# Patient Record
Sex: Male | Born: 1990
Health system: Southern US, Community
[De-identification: ages and names within clinical notes are randomized; demographics above are authoritative.]

## PROBLEM LIST (undated history)

## (undated) DIAGNOSIS — F419 Anxiety disorder, unspecified: Secondary | ICD-10-CM

## (undated) DIAGNOSIS — T7840XA Allergy, unspecified, initial encounter: Secondary | ICD-10-CM

## (undated) HISTORY — PX: REPAIR ANKLE LIGAMENT: SUR1187

## (undated) HISTORY — DX: Allergy, unspecified, initial encounter: T78.40XA

## (undated) HISTORY — PX: WISDOM TOOTH EXTRACTION: SHX21

## (undated) HISTORY — DX: Anxiety disorder, unspecified: F41.9

---
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
## 2010-08-17 ENCOUNTER — Encounter: Payer: Self-pay | Admitting: Family Medicine

## 2010-08-17 DIAGNOSIS — T7840XA Allergy, unspecified, initial encounter: Secondary | ICD-10-CM | POA: Insufficient documentation

## 2011-10-22 ENCOUNTER — Encounter (HOSPITAL_COMMUNITY): Payer: Self-pay | Admitting: Emergency Medicine

## 2011-10-22 ENCOUNTER — Emergency Department (HOSPITAL_COMMUNITY)
Admission: EM | Admit: 2011-10-22 | Discharge: 2011-10-22 | Disposition: A | Discharge status: Home / Self Care | Payer: 59 | Attending: Emergency Medicine | Admitting: Emergency Medicine

## 2011-10-22 DIAGNOSIS — S01319A Laceration without foreign body of unspecified ear, initial encounter: Secondary | ICD-10-CM

## 2011-10-22 DIAGNOSIS — W268XXA Contact with other sharp object(s), not elsewhere classified, initial encounter: Secondary | ICD-10-CM | POA: Insufficient documentation

## 2011-10-22 DIAGNOSIS — S01309A Unspecified open wound of unspecified ear, initial encounter: Secondary | ICD-10-CM | POA: Insufficient documentation

## 2011-10-22 DIAGNOSIS — F17200 Nicotine dependence, unspecified, uncomplicated: Secondary | ICD-10-CM | POA: Insufficient documentation

## 2011-10-22 MED ORDER — TRAMADOL HCL 50 MG PO TABS
50.0000 mg | ORAL_TABLET | Freq: Once | ORAL | Status: AC
  Administered 2011-10-22: 50 mg via ORAL
  Filled 2011-10-22: qty 1

## 2011-10-22 MED ORDER — HYDROCODONE-ACETAMINOPHEN 5-325 MG PO TABS
1.0000 | ORAL_TABLET | Freq: Once | ORAL | Status: AC
  Administered 2011-10-22: 1 via ORAL
  Filled 2011-10-22: qty 1

## 2011-10-22 NOTE — ED Notes (Addendum)
Pt alert, NAD, calm, interactive, skin W&D, resps e/u, speaking in clear complete sentences, lying in stretcher, friend at [facility], ice pack on head.
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
## 2011-10-22 NOTE — ED Notes (Signed)
MD at bedside. 
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
## 2011-10-22 NOTE — ED Notes (Signed)
Pt given ice pack for hematoma above L ear.

## 2011-12-21 NOTE — ED Provider Notes (Signed)
Medical screening examination/treatment/procedure(s) were performed by non-physician practitioner and as supervising physician I was immediately available for consultation/collaboration.  Emani Taussig R. Cyndie Woodbeck, MD 12/21/11 0701 

## 2011-12-21 NOTE — ED Provider Notes (Signed)
  Physical Exam  BP 103/52  Pulse 81  Temp 97.7 F (36.5 C) (Oral)  Resp 18  SpO2 100%  Physical Exam  ED Course  Procedures  LACERATION REPAIR Performed by: Carlyle Dolly Authorized by: Carlyle Dolly Consent: Verbal consent obtained. Risks and benefits: risks, benefits and alternatives were discussed Consent given by: patient Patient identity confirmed: provided demographic data Prepped and Draped in normal sterile fashion Wound explored  Laceration Location: L ear  Laceration Length: 3 cm  No Foreign Bodies seen or palpated  Anesthesia: local infiltration  Local anesthetic: lidocaine 2% wo epinephrine  Anesthetic total: 4 ml  Irrigation method: syringe Amount of cleaning: standard  Skin closure: 7-0 Prolene   Number of sutures: 6  Technique: simple interrupted  Patient tolerance: Patient tolerated the procedure well with no immediate complications.      Carlyle Dolly, PA-C 12/21/11 [phone]

## 2012-03-29 ENCOUNTER — Emergency Department (HOSPITAL_COMMUNITY)
Admission: EM | Admit: 2012-03-29 | Discharge: 2012-03-29 | Disposition: A | Discharge status: Home / Self Care | Payer: 59 | Attending: Emergency Medicine | Admitting: Emergency Medicine

## 2012-03-29 ENCOUNTER — Encounter (HOSPITAL_COMMUNITY): Payer: Self-pay | Admitting: Emergency Medicine

## 2012-03-29 DIAGNOSIS — Z23 Encounter for immunization: Secondary | ICD-10-CM | POA: Insufficient documentation

## 2012-03-29 DIAGNOSIS — F17200 Nicotine dependence, unspecified, uncomplicated: Secondary | ICD-10-CM | POA: Insufficient documentation

## 2012-03-29 DIAGNOSIS — Z203 Contact with and (suspected) exposure to rabies: Secondary | ICD-10-CM

## 2012-03-29 DIAGNOSIS — Y939 Activity, unspecified: Secondary | ICD-10-CM | POA: Insufficient documentation

## 2012-03-29 DIAGNOSIS — IMO0002 Reserved for concepts with insufficient information to code with codable children: Secondary | ICD-10-CM | POA: Insufficient documentation

## 2012-03-29 DIAGNOSIS — W64XXXA Exposure to other animate mechanical forces, initial encounter: Secondary | ICD-10-CM | POA: Insufficient documentation

## 2012-03-29 DIAGNOSIS — Y9289 Other specified places as the place of occurrence of the external cause: Secondary | ICD-10-CM | POA: Insufficient documentation

## 2012-03-29 DIAGNOSIS — Z8589 Personal history of malignant neoplasm of other organs and systems: Secondary | ICD-10-CM | POA: Insufficient documentation

## 2012-03-29 MED ORDER — TETANUS-DIPHTH-ACELL PERTUSSIS 5-2.5-18.5 LF-MCG/0.5 IM SUSP
0.5000 mL | Freq: Once | INTRAMUSCULAR | Status: AC
  Administered 2012-03-29: 0.5 mL via INTRAMUSCULAR
  Filled 2012-03-29: qty 0.5

## 2012-03-29 MED ORDER — RABIES IMMUNE GLOBULIN 150 UNIT/ML IM INJ
20.0000 [IU]/kg | INJECTION | Freq: Once | INTRAMUSCULAR | Status: AC
  Administered 2012-03-29: 2025 [IU] via INTRAMUSCULAR
  Filled 2012-03-29: qty 14
  Filled 2012-03-29: qty 2

## 2012-03-29 MED ORDER — RABIES VACCINE, PCEC IM SUSR
1.0000 mL | Freq: Once | INTRAMUSCULAR | Status: AC
  Administered 2012-03-29: 1 mL via INTRAMUSCULAR
  Filled 2012-03-29: qty 1

## 2012-03-29 NOTE — ED Notes (Signed)
Patient with no complaints at this time. Respirations even and unlabored. Skin warm/dry. Discharge instructions reviewed with patient at this time. Patient given opportunity to voice concerns/ask questions. Patient discharged at this time and left Emergency Department with steady gait.   
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
## 2012-04-02 ENCOUNTER — Encounter (HOSPITAL_COMMUNITY): Payer: 59 | Attending: Emergency Medicine

## 2012-04-02 VITALS — BP 114/70 | HR 64 | Temp 98.0°F | Resp 16

## 2012-04-02 DIAGNOSIS — S61259D Open bite of unspecified finger without damage to nail, subsequent encounter: Secondary | ICD-10-CM

## 2012-04-02 DIAGNOSIS — Z23 Encounter for immunization: Secondary | ICD-10-CM | POA: Insufficient documentation

## 2012-04-02 MED ORDER — RABIES VACCINE, PCEC IM SUSR
1.0000 mL | Freq: Once | INTRAMUSCULAR | Status: AC
  Administered 2012-04-02: 1 mL via INTRAMUSCULAR

## 2012-04-02 NOTE — Progress Notes (Signed)
Samuel Cunningham presents today for injection per MD orders. RabAvert administered IM in right Upper Arm. Administration without incident. Patient tolerated well.  

## 2012-04-06 ENCOUNTER — Encounter (HOSPITAL_COMMUNITY): Payer: 59

## 2012-04-06 DIAGNOSIS — S61259D Open bite of unspecified finger without damage to nail, subsequent encounter: Secondary | ICD-10-CM

## 2012-04-06 MED ORDER — RABIES VACCINE, PCEC IM SUSR
1.0000 mL | Freq: Once | INTRAMUSCULAR | Status: AC
  Administered 2012-04-06: 1 mL via INTRAMUSCULAR

## 2012-04-06 NOTE — Progress Notes (Signed)
Samuel Cunningham presents today for injection per MD orders. RabAvert administered IM in right Upper Arm. Administration without incident. Patient tolerated well.

## 2012-04-13 ENCOUNTER — Ambulatory Visit (HOSPITAL_COMMUNITY): Payer: PRIVATE HEALTH INSURANCE

## 2012-04-16 ENCOUNTER — Encounter (HOSPITAL_COMMUNITY): Payer: 59 | Attending: Emergency Medicine

## 2012-04-16 VITALS — BP 114/67 | HR 72 | Temp 97.3°F | Resp 18

## 2012-04-16 DIAGNOSIS — IMO0001 Reserved for inherently not codable concepts without codable children: Secondary | ICD-10-CM | POA: Insufficient documentation

## 2012-04-16 DIAGNOSIS — S61259S Open bite of unspecified finger without damage to nail, sequela: Secondary | ICD-10-CM

## 2012-04-16 DIAGNOSIS — S61209A Unspecified open wound of unspecified finger without damage to nail, initial encounter: Secondary | ICD-10-CM | POA: Insufficient documentation

## 2012-04-16 MED ORDER — RABIES VACCINE, PCEC IM SUSR
1.0000 mL | Freq: Once | INTRAMUSCULAR | Status: AC
  Administered 2012-04-16: 1 mL via INTRAMUSCULAR

## 2012-04-16 NOTE — Progress Notes (Signed)
[name] presents today for injection per MD orders. Rabavert  administered IM in right Upper Arm. Administration without incident. Patient tolerated well.

## 2013-05-18 ENCOUNTER — Emergency Department: Payer: Self-pay | Admitting: Emergency Medicine

## 2014-05-26 ENCOUNTER — Ambulatory Visit (INDEPENDENT_AMBULATORY_CARE_PROVIDER_SITE_OTHER): Payer: BLUE CROSS/BLUE SHIELD | Admitting: Family Medicine

## 2014-05-26 ENCOUNTER — Encounter: Payer: Self-pay | Admitting: Family Medicine

## 2014-05-26 VITALS — BP 110/80 | HR 78 | Temp 98.3°F | Resp 14 | Ht 72.0 in | Wt 220.0 lb

## 2014-05-26 DIAGNOSIS — F411 Generalized anxiety disorder: Secondary | ICD-10-CM | POA: Diagnosis not present

## 2014-05-26 MED ORDER — ESCITALOPRAM OXALATE 10 MG PO TABS: 10.0000 mg | ORAL_TABLET | Freq: Every day | ORAL | Status: DC

## 2014-05-26 MED ORDER — ALPRAZOLAM 1 MG PO TABS: 1.0000 mg | ORAL_TABLET | Freq: Three times a day (TID) | ORAL | Status: DC | PRN

## 2014-05-26 NOTE — Progress Notes (Signed)
   Subjective:    Patient ID: Samuel Cunningham, male    DOB: 05/20/1990, 24 y.o.   MRN: [id number]  HPI Patient is a very pleasant 24 year old white male who presents today complaining of anxiety problems. Symptoms have been worsening gradually for the last few months however one month ago they acutely worsened. He reports daily anxiety. He generally feels anxious and shaky at all times.  He has trouble sleeping. At times he has panic attacks were has difficult time breathing and he feels like the walls are closing in on him. He reports flushing and feeling hot and feeling lightheaded at times. He also reports some mild depression and anhedonia. His energy level has been okay. He denies any suicidal ideation. He denies any change in his appetite. One month ago the symptoms acutely worsen as he started a new job, his girlfriend broke up with him, and his grandmother was placed on hospice with end-stage dementia. She recently passed away over the weekend and her funeral is today. This all seemed to worsen anxiety. However now the patient is having a hard time just performing his routine daily activities. Past Medical History  Diagnosis Date  . Allergy   . Anxiety    Past Surgical History  Procedure Laterality Date  . Repair ankle ligament     No Known Allergies History   Social History  . Marital Status: Single    Spouse Name: N/A  . Number of Children: N/A  . Years of Education: N/A   Occupational History  . Not on file.   Social History Main Topics  . Smoking status: Passive Smoke Exposure - Never Smoker  . Smokeless tobacco: Current User    Types: Chew  . Alcohol Use: Yes  . Drug Use: No  . Sexual Activity: Not on file   Other Topics Concern  . Not on file   Social History Narrative   Family History  Problem Relation Age of Onset  . Hypertension Mother   . Mental illness Brother     anxiety      Review of Systems  All other systems reviewed and are negative.        Objective:   Physical Exam  Cardiovascular: Normal rate, regular rhythm, normal heart sounds and intact distal pulses.   Pulmonary/Chest: Effort normal and breath sounds normal.  Psychiatric: He has a normal mood and affect. His behavior is normal. Judgment and thought content normal.  Vitals reviewed.         Assessment & Plan:  GAD (generalized anxiety disorder) - Plan: escitalopram (LEXAPRO) 10 MG tablet, ALPRAZolam (XANAX) 1 MG tablet  Patient would like to take something that is not addictive and non-habit-forming to help prevent anxiety. He is very leery of taking medications he could become addicted to. Therefore we will start the patient on Lexapro 10 mg by mouth daily and recheck in one month. I did give the patient a prescription for Xanax 1 mg tablets. He can take one half a tablet to 1 tablet every 8 hours as needed for panic attack. I gave him 10 tablets and asked the patient to take these sparingly just as needed for severe anxiety at least until the SSRI takes effect. Recheck in one month or sooner if worse

## 2014-06-13 ENCOUNTER — Other Ambulatory Visit: Payer: Self-pay | Admitting: Family Medicine

## 2014-06-13 ENCOUNTER — Encounter: Payer: Self-pay | Admitting: Family Medicine

## 2014-06-13 NOTE — Telephone Encounter (Signed)
This encounter was created in error - please disregard.

## 2014-06-13 NOTE — Telephone Encounter (Signed)
Opened in error

## 2014-06-13 NOTE — Telephone Encounter (Signed)
Ok for #10 

## 2014-06-13 NOTE — Telephone Encounter (Signed)
rx called in #10 only 

## 2014-06-13 NOTE — Telephone Encounter (Signed)
Ok to refill??  Last office visit/ refill 05/26/2014, #10 tabs.

## 2014-07-03 ENCOUNTER — Other Ambulatory Visit: Payer: Self-pay | Admitting: Family Medicine

## 2014-07-03 NOTE — Telephone Encounter (Signed)
Ok to refill??  Last office visit 05/26/2014.  Last refill 06/13/2014.

## 2014-07-04 NOTE — Telephone Encounter (Signed)
Ok with refill (10)

## 2014-07-04 NOTE — Telephone Encounter (Signed)
rx called in #10 only

## 2014-07-25 ENCOUNTER — Telehealth: Payer: Self-pay | Admitting: Family Medicine

## 2014-07-25 NOTE — Telephone Encounter (Signed)
Patient is calling to speak with you only about his lexapro  [phone]

## 2014-07-28 MED ORDER — ALPRAZOLAM 1 MG PO TABS: 1.0000 mg | ORAL_TABLET | Freq: Every day | ORAL | Status: DC

## 2014-07-28 NOTE — Telephone Encounter (Signed)
Med called to pharm 

## 2014-07-28 NOTE — Telephone Encounter (Signed)
Please call in 30 xanax 1 mg poqday prn

## 2014-08-04 ENCOUNTER — Ambulatory Visit (INDEPENDENT_AMBULATORY_CARE_PROVIDER_SITE_OTHER): Payer: BLUE CROSS/BLUE SHIELD | Admitting: Family Medicine

## 2014-08-04 ENCOUNTER — Encounter: Payer: Self-pay | Admitting: Family Medicine

## 2014-08-04 VITALS — BP 110/62 | HR 68 | Temp 98.9°F | Resp 14 | Ht 72.0 in | Wt 223.0 lb

## 2014-08-04 DIAGNOSIS — F13939 Sedative, hypnotic or anxiolytic use, unspecified with withdrawal, unspecified: Secondary | ICD-10-CM

## 2014-08-04 DIAGNOSIS — L739 Follicular disorder, unspecified: Secondary | ICD-10-CM

## 2014-08-04 DIAGNOSIS — F13239 Sedative, hypnotic or anxiolytic dependence with withdrawal, unspecified: Secondary | ICD-10-CM | POA: Diagnosis not present

## 2014-08-04 MED ORDER — DOXYCYCLINE HYCLATE 100 MG PO TABS: 100.0000 mg | ORAL_TABLET | Freq: Two times a day (BID) | ORAL | Status: DC

## 2014-08-04 NOTE — Progress Notes (Signed)
Subjective:    Patient ID: Samuel Cunningham, male    DOB: 07-11-1990, 24 y.o.   MRN: [id number]  HPI 05/26/14 Patient is a very pleasant 24 year old white male who presents today complaining of anxiety problems. Symptoms have been worsening gradually for the last few months however one month ago they acutely worsened. He reports daily anxiety. He generally feels anxious and shaky at all times.  He has trouble sleeping. At times he has panic attacks were has difficult time breathing and he feels like the walls are closing in on him. He reports flushing and feeling hot and feeling lightheaded at times. He also reports some mild depression and anhedonia. His energy level has been okay. He denies any suicidal ideation. He denies any change in his appetite. One month ago the symptoms acutely worsen as he started a new job, his girlfriend broke up with him, and his grandmother was placed on hospice with end-stage dementia. She recently passed away over the weekend and her funeral is today. This all seemed to worsen anxiety. However now the patient is having a hard time just performing his routine daily activities.  At that time, my plan was:  Patient would like to take something that is not addictive and non-habit-forming to help prevent anxiety. He is very leery of taking medications he could become addicted to. Therefore we will start the patient on Lexapro 10 mg by mouth daily and recheck in one month. I did give the patient a prescription for Xanax 1 mg tablets. He can take one half a tablet to 1 tablet every 8 hours as needed for panic attack. I gave him 10 tablets and asked the patient to take these sparingly just as needed for severe anxiety at least until the SSRI takes effect. Recheck in one month or sooner if worse  08/04/14 Thursday, patient abruptly stopped lexapro by accident.  Medication was helping, he just didn't get refill on time.  By Sat, felt dizzy, nauseated, weak, with dull headache.   Denies fever, chills, vomiting, or diarrhea.  Resumed medication Sunday night and symptoms have already improved.  Also has small white pinpoint pustules on thigh near scrotum. Past Medical History  Diagnosis Date  . Allergy   . Anxiety    Past Surgical History  Procedure Laterality Date  . Repair ankle ligament     No Known Allergies History   Social History  . Marital Status: Single    Spouse Name: N/A  . Number of Children: N/A  . Years of Education: N/A   Occupational History  . Not on file.   Social History Main Topics  . Smoking status: Passive Smoke Exposure - Never Smoker  . Smokeless tobacco: Current User    Types: Chew  . Alcohol Use: Yes  . Drug Use: No  . Sexual Activity: Not on file   Other Topics Concern  . Not on file   Social History Narrative   Family History  Problem Relation Age of Onset  . Hypertension Mother   . Mental illness Brother     anxiety      Review of Systems  All other systems reviewed and are negative.      Objective:   Physical Exam  Cardiovascular: Normal rate, regular rhythm, normal heart sounds and intact distal pulses.   Pulmonary/Chest: Effort normal and breath sounds normal.  Psychiatric: He has a normal mood and affect. His behavior is normal. Judgment and thought content normal.  Vitals reviewed.  Assessment & Plan:  Folliculitis - Plan: doxycycline (VIBRA-TABS) 100 MG tablet  Withdrawal from sedative, hypnotic, or anxiolytic drug  Resume lexapro and avoid abrupt discontinuation in future.  Doxycycline 100 mg pobid for 10 days.  Recheck in 3 months or as needed.

## 2014-08-05 ENCOUNTER — Encounter: Payer: Self-pay | Admitting: Family Medicine

## 2014-08-28 ENCOUNTER — Other Ambulatory Visit: Payer: Self-pay | Admitting: Family Medicine

## 2014-08-28 NOTE — Telephone Encounter (Signed)
?   OK to Refill  

## 2014-08-29 NOTE — Telephone Encounter (Signed)
Medication refilled per protocol. 

## 2014-08-29 NOTE — Telephone Encounter (Signed)
ok 

## 2014-09-29 ENCOUNTER — Other Ambulatory Visit: Payer: Self-pay | Admitting: Family Medicine

## 2014-09-30 NOTE — Telephone Encounter (Signed)
Medication called to pharmacy. 

## 2014-09-30 NOTE — Telephone Encounter (Signed)
ok 

## 2014-09-30 NOTE — Telephone Encounter (Signed)
?   OK to Refill  

## 2014-11-06 ENCOUNTER — Other Ambulatory Visit: Payer: Self-pay | Admitting: Family Medicine

## 2014-11-07 NOTE — Telephone Encounter (Signed)
Medication called to pharmacy. 

## 2014-11-07 NOTE — Telephone Encounter (Signed)
Ok to refill??  Last office visit 08/04/2014.  Last refill 09/30/2014.

## 2014-11-07 NOTE — Telephone Encounter (Signed)
ok 

## 2014-12-02 ENCOUNTER — Other Ambulatory Visit: Payer: Self-pay | Admitting: Family Medicine

## 2014-12-02 NOTE — Telephone Encounter (Signed)
Refill appropriate and filled per protocol. 

## 2015-03-06 ENCOUNTER — Other Ambulatory Visit: Payer: Self-pay | Admitting: Family Medicine

## 2015-03-06 NOTE — Telephone Encounter (Signed)
?   OK to Refill  Last refill 11/07/14

## 2015-03-06 NOTE — Telephone Encounter (Signed)
ok 

## 2015-04-08 ENCOUNTER — Encounter: Payer: Self-pay | Admitting: Family Medicine

## 2015-04-08 ENCOUNTER — Ambulatory Visit (INDEPENDENT_AMBULATORY_CARE_PROVIDER_SITE_OTHER): Payer: BLUE CROSS/BLUE SHIELD | Admitting: Family Medicine

## 2015-04-08 VITALS — BP 130/72 | HR 80 | Temp 101.4°F | Resp 16 | Ht 72.0 in | Wt 205.0 lb

## 2015-04-08 DIAGNOSIS — J111 Influenza due to unidentified influenza virus with other respiratory manifestations: Secondary | ICD-10-CM | POA: Diagnosis not present

## 2015-04-08 LAB — INFLUENZA A AND B AG, IMMUNOASSAY
INFLUENZA A ANTIGEN: NOT DETECTED
INFLUENZA B ANTIGEN: NOT DETECTED

## 2015-04-08 MED ORDER — OSELTAMIVIR PHOSPHATE 75 MG PO CAPS: 75.0000 mg | ORAL_CAPSULE | Freq: Two times a day (BID) | ORAL | Status: DC

## 2015-04-08 MED ORDER — ACETAMINOPHEN 500 MG PO TABS
1000.0000 mg | ORAL_TABLET | Freq: Once | ORAL | Status: AC
  Administered 2015-04-08: 1000 mg via ORAL

## 2015-04-08 NOTE — Progress Notes (Signed)
Patient ID: Samuel Cunningham, male   DOB: 06/20/1990, 25 y.o.   MRN: [id number]   Subjective:    Patient ID: Samuel Cunningham, male    DOB: 1990/04/19, 25 y.o.   MRN: [id number]  Patient presents for Illness   Patient here with fever body aches/ chills headache congestion started last night. His father has bronchitis currently. He has not had any nausea vomiting no diarrhea. He took Mucinex DM  no known sick contacts. Feels like previous Flu  He works in [employer]     Review Of Systems:  GEN- denies fatigue, fever, weight loss,weakness, recent illness HEENT- denies eye drainage, change in vision, nasal discharge, CVS- denies chest pain, palpitations RESP- denies SOB, cough, wheeze ABD- denies N/V, change in stools, abd pain GU- denies dysuria, hematuria, dribbling, incontinence MSK- denies joint pain, +muscle aches, injury Neuro- +headache, dizziness, syncope, seizure activity       Objective:    BP 130/72 mmHg  Pulse 80  Temp(Src) 101.4 F (38.6 C) (Oral)  Resp 16  Ht 6' (1.829 m)  Wt 205 lb (92.987 kg)  BMI 27.80 kg/m2  SpO2 98% GEN- NAD, alert and oriented x3Febrile, ill appearing  HEENT- PERRL, EOMI, non injected sclera, pink conjunctiva, MMM, oropharynx clear, nares clear, TM clear bilat, no sinus tenderness Neck- Supple, noLAD  CVS- RRR, no murmur RESP-CTAB ABD-NABS,soft,NT,ND EXT- No edema Pulses- Radial  2+        Assessment & Plan:      Problem List Items Addressed This Visit    None    Visit Diagnoses    Influenza    -  Primary    Early influenza symptoms, given Tylenol in office, discsused fever reducers, given Tamiflu, CONTINUE mucinex    Relevant Medications    oseltamivir (TAMIFLU) 75 MG capsule    acetaminophen (TYLENOL) tablet 1,000 mg (Completed)    Other Relevant Orders    Influenza A and B Ag, Immunoassay (Completed)       Note: This dictation was prepared with Dragon dictation along with smaller phrase technology. Any  transcriptional errors that result from this process are unintentional.

## 2015-04-08 NOTE — Patient Instructions (Addendum)
Take flu medication as prescribed  Work note for Wed- Friday Can return Monday  No working out for 1 week [place] of fluids  F/U as needed

## 2015-04-10 ENCOUNTER — Other Ambulatory Visit: Payer: Self-pay | Admitting: Family Medicine

## 2015-04-10 NOTE — Telephone Encounter (Signed)
ok 

## 2015-04-10 NOTE — Telephone Encounter (Signed)
LRF 03/06/15 #30  LOV 08/04/14   OK refill?

## 2015-04-13 NOTE — Telephone Encounter (Signed)
rx called in

## 2015-04-27 ENCOUNTER — Encounter: Payer: Self-pay | Admitting: Physician Assistant

## 2015-04-27 ENCOUNTER — Ambulatory Visit (INDEPENDENT_AMBULATORY_CARE_PROVIDER_SITE_OTHER): Payer: BLUE CROSS/BLUE SHIELD | Admitting: Physician Assistant

## 2015-04-27 ENCOUNTER — Encounter: Payer: Self-pay | Admitting: Family Medicine

## 2015-04-27 VITALS — BP 120/70 | HR 76 | Temp 98.6°F | Resp 18 | Wt 204.0 lb

## 2015-04-27 DIAGNOSIS — J988 Other specified respiratory disorders: Secondary | ICD-10-CM

## 2015-04-27 DIAGNOSIS — R6883 Chills (without fever): Secondary | ICD-10-CM

## 2015-04-27 DIAGNOSIS — J029 Acute pharyngitis, unspecified: Secondary | ICD-10-CM | POA: Diagnosis not present

## 2015-04-27 DIAGNOSIS — B9689 Other specified bacterial agents as the cause of diseases classified elsewhere: Secondary | ICD-10-CM

## 2015-04-27 LAB — INFLUENZA A AND B AG, IMMUNOASSAY
INFLUENZA A ANTIGEN: NOT DETECTED
INFLUENZA B ANTIGEN: NOT DETECTED

## 2015-04-27 LAB — STREP GROUP A AG, W/REFLEX TO CULT: STREGTOCOCCUS GROUP A AG SCREEN: NOT DETECTED

## 2015-04-27 MED ORDER — BENZONATATE 200 MG PO CAPS: 200.0000 mg | ORAL_CAPSULE | Freq: Two times a day (BID) | ORAL | Status: DC | PRN

## 2015-04-27 MED ORDER — AZITHROMYCIN 250 MG PO TABS: ORAL_TABLET | ORAL | Status: DC

## 2015-04-27 NOTE — Progress Notes (Signed)
    Patient ID: Samuel RenoMichael C Cunningham MRN: [id number], DOB: [birth year], 25 y.o. Date of Encounter: 04/27/2015, 3:04 PM    Chief Complaint:  Chief Complaint  Patient presents with  . sick x 2 weeks    had cold, thought better, now last few days feels awful, chills, body aches     HPI: 25 y.o. year old white male presents with above.   In addition to symptoms listed above, he says that he is having congestion in his head and nose and also in his chest. Says his throat does feel sore. Says he does not have a thermometer so has not checked temperature. Says a coworker has strep throat but no other known sick contacts. Works with [employer].     Home Meds:   Outpatient Prescriptions Prior to Visit  Medication Sig Dispense Refill  . escitalopram (LEXAPRO) 10 MG tablet TAKE 1 TABLET BY MOUTH AT BEDTIME. 30 tablet 2  . ALPRAZolam (XANAX) 1 MG tablet TAKE 1 TABLET EVERY DAY 30 tablet 0  . oseltamivir (TAMIFLU) 75 MG capsule Take 1 capsule (75 mg total) by mouth 2 (two) times daily. 10 capsule 0   No facility-administered medications prior to visit.    Allergies:  Allergies  Allergen Reactions  . Codeine Itching and Nausea Only      Review of Systems: See HPI for pertinent ROS. All other ROS negative.    Physical Exam: Blood pressure 120/70, pulse 76, temperature 98.6 F (37 C), temperature source Oral, resp. rate 18, weight 204 lb (92.534 kg)., Body mass index is 27.66 kg/(m^2). General:  WNWD WM. Appears in no acute distress. HEENT: Normocephalic, atraumatic, eyes without discharge, sclera non-icteric, nares are without discharge. Bilateral auditory canals clear, TM's are without perforation, pearly grey and translucent with reflective cone of light bilaterally. Oral cavity moist, posterior pharynx without exudate, erythema, peritonsillar abscess.  Neck: Supple. No thyromegaly. No lymphadenopathy. Lungs: Clear bilaterally to auscultation without wheezes, rales, or rhonchi. Breathing  is unlabored. Heart: Regular rhythm. No murmurs, rubs, or gallops. Msk:  Strength and tone normal for age. Extremities/Skin: Warm and dry. Neuro: Alert and oriented X 3. Moves all extremities spontaneously. Gait is normal. CNII-XII grossly in tact. Psych:  Responds to questions appropriately with a normal affect.   Results for orders placed or performed in visit on 04/27/15  STREP GROUP A AG, W/REFLEX TO CULT  Result Value Ref Range   SOURCE THROAT    STREGTOCOCCUS GROUP A AG SCREEN Not Detected   Influenza A and B Ag, Immunoassay  Result Value Ref Range   Source: NASAL    Influenza A Antigen Not Detected Not Detected   Influenza B Antigen Not Detected Not Detected     ASSESSMENT AND PLAN:  25 y.o. year old male with  1. Bacterial respiratory infection Take antibiotic as directed. Can use Tessalon for cough suppressant. Continues over-the-counter medications for other symptom relief. Note given for out of work today and tomorrow. Follow-up if needed. - azithromycin (ZITHROMAX) 250 MG tablet; Day 1: Take 2 daily. Days 2-5: Take 1 daily.  Dispense: 6 tablet; Refill: 0 - benzonatate (TESSALON) 200 MG capsule; Take 1 capsule (200 mg total) by mouth 2 (two) times daily as needed for cough.  Dispense: 20 capsule; Refill: 0  2. Chills without fever - Influenza A and B Ag, Immunoassay  3. Sorethroat - STREP GROUP A AG, W/REFLEX TO CULT   Signed, [address] AvenueMary Beth BangsDixon, GeorgiaPA, [facility] 04/27/2015 3:04 PM

## 2015-05-18 ENCOUNTER — Other Ambulatory Visit: Payer: Self-pay | Admitting: Family Medicine

## 2015-05-18 NOTE — Telephone Encounter (Signed)
Ok to refill??  Last office visit 04/27/2015.  Last refill 04/13/2015.

## 2015-05-19 NOTE — Telephone Encounter (Signed)
Medication called to pharmacy. 

## 2015-05-19 NOTE — Telephone Encounter (Signed)
ok 

## 2015-06-22 ENCOUNTER — Other Ambulatory Visit: Payer: Self-pay | Admitting: Family Medicine

## 2015-06-22 NOTE — Telephone Encounter (Signed)
ok 

## 2015-06-22 NOTE — Telephone Encounter (Signed)
Medication called to pharmacy. 

## 2015-06-22 NOTE — Telephone Encounter (Signed)
Ok to refill??  Last office visit 04/27/2015.  Last refill 05/19/2015.

## 2015-08-04 ENCOUNTER — Other Ambulatory Visit: Payer: Self-pay | Admitting: Family Medicine

## 2015-08-04 NOTE — Telephone Encounter (Signed)
Ok to refill??  Last office visit 04/27/2015.  Last refill 06/22/2015.

## 2015-08-06 NOTE — Telephone Encounter (Signed)
ok 

## 2015-08-07 ENCOUNTER — Other Ambulatory Visit: Payer: Self-pay | Admitting: Family Medicine

## 2015-08-07 MED ORDER — ESCITALOPRAM OXALATE 10 MG PO TABS: 10.0000 mg | ORAL_TABLET | Freq: Every day | ORAL | Status: DC

## 2015-08-07 NOTE — Telephone Encounter (Signed)
Medication called/sent to requested pharmacy  

## 2015-08-07 NOTE — Telephone Encounter (Signed)
Medication called to pharmacy. 

## 2015-09-11 ENCOUNTER — Telehealth: Payer: Self-pay | Admitting: Family Medicine

## 2015-09-11 NOTE — Telephone Encounter (Signed)
Ok with 30, but if he NTBS.  This was never intended to be long term.

## 2015-09-11 NOTE — Telephone Encounter (Signed)
CB# [phone]  Patient stopped by would like a refill on his xanax he is requesting a 60 tablets instead of 30 called into [facility] on [address].

## 2015-09-14 MED ORDER — ALPRAZOLAM 1 MG PO TABS: ORAL_TABLET | ORAL | 0 refills | Status: DC

## 2015-09-14 NOTE — Telephone Encounter (Signed)
Patient aware of providers recommendations and med called to pharm

## 2015-09-30 ENCOUNTER — Ambulatory Visit (INDEPENDENT_AMBULATORY_CARE_PROVIDER_SITE_OTHER): Payer: BLUE CROSS/BLUE SHIELD | Admitting: Physician Assistant

## 2015-09-30 ENCOUNTER — Encounter: Payer: Self-pay | Admitting: Family Medicine

## 2015-09-30 ENCOUNTER — Encounter: Payer: Self-pay | Admitting: Physician Assistant

## 2015-09-30 VITALS — BP 128/62 | HR 55 | Temp 97.6°F | Resp 16 | Ht 72.0 in | Wt 200.0 lb

## 2015-09-30 DIAGNOSIS — R0982 Postnasal drip: Secondary | ICD-10-CM

## 2015-09-30 DIAGNOSIS — K5900 Constipation, unspecified: Secondary | ICD-10-CM

## 2015-09-30 DIAGNOSIS — R5383 Other fatigue: Secondary | ICD-10-CM | POA: Diagnosis not present

## 2015-09-30 DIAGNOSIS — G44209 Tension-type headache, unspecified, not intractable: Secondary | ICD-10-CM | POA: Diagnosis not present

## 2015-09-30 DIAGNOSIS — L309 Dermatitis, unspecified: Secondary | ICD-10-CM

## 2015-09-30 LAB — CBC WITH DIFFERENTIAL/PLATELET
BASOS ABS: 42 {cells}/uL (ref 0–200)
Basophils Relative: 1 %
EOS PCT: 2 %
Eosinophils Absolute: 84 cells/uL (ref 15–500)
HEMATOCRIT: 38.3 % — AB (ref 38.5–50.0)
HEMOGLOBIN: 12.7 g/dL — AB (ref 13.0–17.0)
LYMPHS ABS: 1596 {cells}/uL (ref 850–3900)
Lymphocytes Relative: 38 %
MCH: 31.6 pg (ref 27.0–33.0)
MCHC: 33.2 g/dL (ref 32.0–36.0)
MCV: 95.3 fL (ref 80.0–100.0)
MONO ABS: 336 {cells}/uL (ref 200–950)
MPV: 10.1 fL (ref 7.5–12.5)
Monocytes Relative: 8 %
NEUTROS ABS: 2142 {cells}/uL (ref 1500–7800)
Neutrophils Relative %: 51 %
Platelets: 236 10*3/uL (ref 140–400)
RBC: 4.02 MIL/uL — AB (ref 4.20–5.80)
RDW: 13.5 % (ref 11.0–15.0)
WBC: 4.2 10*3/uL (ref 3.8–10.8)

## 2015-09-30 LAB — COMPLETE METABOLIC PANEL WITH GFR
ALBUMIN: 4.1 g/dL (ref 3.6–5.1)
ALK PHOS: 52 U/L (ref 40–115)
ALT: 21 U/L (ref 9–46)
AST: 22 U/L (ref 10–40)
BILIRUBIN TOTAL: 0.7 mg/dL (ref 0.2–1.2)
BUN: 14 mg/dL (ref 7–25)
CO2: 30 mmol/L (ref 20–31)
Calcium: 9 mg/dL (ref 8.6–10.3)
Chloride: 104 mmol/L (ref 98–110)
Creat: 0.96 mg/dL (ref 0.60–1.35)
GFR, Est African American: >89 mL/min (ref >=60)
GFR, Est Non African American: >89 mL/min (ref >=60)
GLUCOSE: 87 mg/dL (ref 70–99)
Potassium: 4.6 mmol/L (ref 3.5–5.3)
SODIUM: 140 mmol/L (ref 135–146)
TOTAL PROTEIN: 6 g/dL — AB (ref 6.1–8.1)

## 2015-09-30 LAB — TSH: TSH: 0.98 mIU/L (ref 0.40–4.50)

## 2015-09-30 MED ORDER — LINACLOTIDE 72 MCG PO CAPS: 72.0000 ug | ORAL_CAPSULE | Freq: Every day | ORAL | 2 refills | Status: DC

## 2015-09-30 MED ORDER — METAXALONE 800 MG PO TABS: 800.0000 mg | ORAL_TABLET | Freq: Three times a day (TID) | ORAL | 1 refills | Status: DC

## 2015-09-30 NOTE — Progress Notes (Signed)
Patient ID: Roseanne RenoMichael C Bluitt MRN: [id number], DOB: 1991/01/31, 25 y.o. Date of Encounter: @DATE @  Chief Complaint:  Chief Complaint  Patient presents with  . Dizziness    symptoms began Monday   . Fatigue  . Nausea  . Headache    HPI: 25 y.o. year old white male  presents with above.   Discussed above symptoms with him.  Says that since Monday he has had some nausea. Has had no vomiting. Had no diarrhea. Is that he actually has had some constipation----"like small rabbit terds for last 2 - 3 days"  Asked about his headache. He points to temple region bilaterally as area of headache.  Past of these had any nasal congestion or drainage from his nose---no. Also no chest congestion or cough. Says he sometimes feels some drainage down his throat.  Says that he has been exercising for 7 months. Runs 5 miles and goes to the gym. Says that yesterday he had to stop halfway through his exercise "because something just didn't feel right". As this morning that he should come here and get checked out rather than going to work right now.  Later adds that he has been using some testosterone booster and creatine for about 2 weeks and doesn't know if that would be causing any of this.  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 Says that he lives beside of his parents and his parents been mad because they have had to move in with him. He doesn't elaborate further on that.  Asked about sleep. Says  2 or 3 weeks ago he wasn't sleeping good. Started taking Xanax and has been sleeping good. However now feels like he has no energy.  Viewed his medications. He states that he is taking the Lexapro.  Says that he  also needs a referral to dermatology. Visit he has been having a rash in his testicle area and his Arty been seen for 5 times with different doctors and none of the treatments have worked and at this point he just wants a referral.  Past Medical History:  Diagnosis Date  . Allergy   . Anxiety      Home Meds: Outpatient Medications Prior to Visit  Medication Sig Dispense Refill  . ALPRAZolam (XANAX) 1 MG tablet TAKE 1 TABLET BY MOUTH EVERY DAY AS NEEDED FOR ANXIETY 30 tablet 0  . escitalopram (LEXAPRO) 10 MG tablet Take 1 tablet (10 mg total) by mouth at bedtime. 90 tablet 3  . azithromycin (ZITHROMAX) 250 MG tablet Day 1: Take 2 daily. Days 2-5: Take 1 daily. (Patient not taking: Reported on 09/30/2015) 6 tablet 0  . benzonatate (TESSALON) 200 MG capsule Take 1 capsule (200 mg total) by mouth 2 (two) times daily as needed for cough. (Patient not taking: Reported on 09/30/2015) 20 capsule 0   No facility-administered medications prior to visit.     Allergies:  Allergies  Allergen Reactions  . Codeine Itching and Nausea Only    Social History   Social History  . Marital status: Single    Spouse name: N/A  . Number of children: N/A  . Years of education: N/A   Occupational History  . Not  on file.   Social History Main Topics  . Smoking status: Passive Smoke Exposure - Never Smoker  . Smokeless tobacco: Current User    Types: Chew  . Alcohol use Yes  . Drug use: No  . Sexual activity: Not on file   Other Topics Concern  . Not on file   Social History Narrative  . No narrative on file    Family History  Problem Relation Age of Onset  . Hypertension Mother   . Mental illness Brother     anxiety     Review of Systems:  See HPI for pertinent ROS. All other ROS negative.    Physical Exam: Blood pressure 128/62, pulse (!) 55, temperature 97.6 F (36.4 C), temperature source Oral, resp. rate 16, height 6' (1.829 m), weight 200 lb (90.7 kg), SpO2 99 %., Body mass  index is 27.12 kg/m. General: WNWD WM. Appears in no acute distress. Head: Normocephalic, atraumatic, eyes without discharge, sclera non-icteric, nares are without discharge. Bilateral auditory canals clear, TM's are without perforation, pearly grey and translucent with reflective cone of light bilaterally. Oral cavity moist, posterior pharynx without exudate, erythema, peritonsillar abscess.  Neck: Supple. No thyromegaly. No lymphadenopathy. Lungs: Clear bilaterally to auscultation without wheezes, rales, or rhonchi. Breathing is unlabored. Heart: RRR with S1 S2. No murmurs, rubs, or gallops. Abdomen: Soft, non-tender, non-distended with normoactive bowel sounds. No hepatomegaly. No rebound/guarding. No obvious abdominal masses. Musculoskeletal:  Strength and tone normal for age. Extremities/Skin: Warm and dry.  Neuro: Alert and oriented X 3. Moves all extremities spontaneously. Gait is normal. CNII-XII grossly in tact. Psych:  Responds to questions appropriately with a normal affect.     ASSESSMENT AND PLAN:  25 y.o. year old male with  1. Other fatigue - CBC with Differential/Platelet - COMPLETE METABOLIC PANEL WITH GFR - TSH - B. burgdorfi antibodies by WB - Rocky mtn spotted fvr abs pnl(IgG+IgM)  2. Tension headache - CBC with Differential/Platelet - COMPLETE METABOLIC PANEL WITH GFR - TSH - B. burgdorfi antibodies by WB - Rocky mtn spotted fvr abs pnl(IgG+IgM) - metaxalone (SKELAXIN) 800 MG tablet; Take 1 tablet (800 mg total) by mouth 3 (three) times daily.  Dispense: 30 tablet; Refill: 1  3. Postnasal drip  4. Constipation, unspecified constipation type - CBC with Differential/Platelet - COMPLETE METABOLIC PANEL WITH GFR - TSH - linaclotide (LINZESS) 72 MCG capsule; Take 1 capsule (72 mcg total) by mouth daily before breakfast.  Dispense: 30 capsule; Refill: 2  5. Dermatitis - Ambulatory referral to Dermatology  Will have him start taking Skelaxin for muscle relaxer  for his tension headaches. As well he is taking his Lexapro to help with anxiety/stress. Will have him take Linzess for the constipation.----Gave him sample bottle as well as Rx Will check the above labs to check for underlying causes to symptoms. Told him to stop the testosterone booster and creatine.  F/U PRN  Signed, [facility] HospitalMary Beth HindsboroDixon, [place], [facility] 09/30/2015 10:05 AM

## 2015-10-02 LAB — ROCKY MTN SPOTTED FVR ABS PNL(IGG+IGM)
RMSF IgG: NOT DETECTED
RMSF IgM: NOT DETECTED

## 2015-10-06 DIAGNOSIS — L723 Sebaceous cyst: Secondary | ICD-10-CM | POA: Diagnosis not present

## 2015-10-06 DIAGNOSIS — L298 Other pruritus: Secondary | ICD-10-CM | POA: Diagnosis not present

## 2015-10-06 LAB — LYME ABY, WSTRN BLT IGG & IGM W/BANDS
B BURGDORFERI IGM ABS (IB): NEGATIVE
B burgdorferi IgG Abs (IB): NEGATIVE
LYME DISEASE 18 KD IGG: NONREACTIVE
LYME DISEASE 23 KD IGM: REACTIVE — AB
LYME DISEASE 39 KD IGG: NONREACTIVE
LYME DISEASE 39 KD IGM: NONREACTIVE
LYME DISEASE 41 KD IGM: NONREACTIVE
LYME DISEASE 58 KD IGG: NONREACTIVE
Lyme Disease 23 kD IgG: NONREACTIVE
Lyme Disease 28 kD IgG: NONREACTIVE
Lyme Disease 30 kD IgG: NONREACTIVE
Lyme Disease 41 kD IgG: NONREACTIVE
Lyme Disease 45 kD IgG: NONREACTIVE
Lyme Disease 66 kD IgG: NONREACTIVE
Lyme Disease 93 kD IgG: NONREACTIVE

## 2015-11-10 ENCOUNTER — Other Ambulatory Visit: Payer: Self-pay | Admitting: Family Medicine

## 2015-11-11 NOTE — Telephone Encounter (Signed)
Ok to refill 

## 2015-11-12 NOTE — Telephone Encounter (Signed)
ok 

## 2015-11-12 NOTE — Telephone Encounter (Signed)
Medication called to pharmacy. 

## 2015-11-20 ENCOUNTER — Ambulatory Visit (INDEPENDENT_AMBULATORY_CARE_PROVIDER_SITE_OTHER): Payer: Self-pay | Admitting: Family Medicine

## 2015-11-20 ENCOUNTER — Encounter: Payer: Self-pay | Admitting: Family Medicine

## 2015-11-20 VITALS — BP 118/68 | HR 120 | Temp 100.1°F | Resp 14 | Ht 72.0 in | Wt 197.0 lb

## 2015-11-20 DIAGNOSIS — M7711 Lateral epicondylitis, right elbow: Secondary | ICD-10-CM | POA: Diagnosis not present

## 2015-11-20 DIAGNOSIS — B9789 Other viral agents as the cause of diseases classified elsewhere: Secondary | ICD-10-CM

## 2015-11-20 DIAGNOSIS — J069 Acute upper respiratory infection, unspecified: Secondary | ICD-10-CM | POA: Diagnosis not present

## 2015-11-20 DIAGNOSIS — R509 Fever, unspecified: Secondary | ICD-10-CM | POA: Diagnosis not present

## 2015-11-20 MED ORDER — DICLOFENAC SODIUM 75 MG PO TBEC: 75.0000 mg | DELAYED_RELEASE_TABLET | Freq: Two times a day (BID) | ORAL | 0 refills | Status: DC

## 2015-11-20 NOTE — Progress Notes (Signed)
   Subjective:    Patient ID: Samuel RenoMichael C Welby, male    DOB: 1991-01-17, 25 y.o.   MRN: [id number]  Patient presents for Illness (x1 day- headache, muscle aches, fever) and R Elow Pain (x1 month- states that he has pain in elbow and thinks it's tendonitis) pt  here with less than 24 hours of headache muscle aches fever cough with congestion. He took NyQuil last night. No known sick contacts. He did have some nausea and no diarrhea  He also complains of right elbow pain on the lateral aspect for the past month. 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 He is not taking anything over-the-counter he has been using an elbow brace which is helped  Review Of Systems:  GEN- denies fatigue, fever, weight loss,weakness, recent illness HEENT- denies eye drainage, change in vision, nasal discharge, CVS- denies chest pain, palpitations RESP- denies SOB, cough, wheeze ABD- denies N/V, change in stools, abd pain GU- denies dysuria, hematuria, dribbling, incontinence MSK- +joint pain, muscle aches, injury Neuro- denies headache, dizziness, syncope, seizure activity       Objective:    BP 118/68 (BP Location: Right Arm, Patient Position: Sitting, Cuff Size: Large)   Pulse (!) 120   Temp 100.1 F (37.8 C) (Oral)   Resp 14   Ht 6' (1.829 m)   Wt 197 lb (89.4 kg)   SpO2 98% Comment: RA  BMI 26.72 kg/m  GEN- NAD, alert and oriented x3 HEENT- PERRL, EOMI, non injected sclera, pink conjunctiva, MMM, oropharynx clear TM clear bilat no effusion,  No maxillary sinus tenderness, inflammed turbinates,  Nasal drainage  Neck- Supple, no LAD CVS- RRR, no murmur RESP-CTAB MSK- Right elbow mild swelling lateral aspect of elbow over epicondyle and extensors, TTP, biceps in tact, pain with rotation of wrist EXT- No edema Pulses- Radial 2+          Assessment & Plan:      Problem List Items  Addressed This Visit    None    Visit Diagnoses    Viral URI    -  Primary   Neg flu, OTC cough medicine, rest fluids    Relevant Orders   Influenza A and B Ag, Immunoassay   Lateral epicondylitis of right elbow       Use brace, NSAIDS, not improved referral to ortho for injection   Relevant Medications   diclofenac (VOLTAREN) 75 MG EC tablet      Note: This dictation was prepared with Dragon dictation along with smaller phrase technology. Any transcriptional errors that result from this process are unintentional.

## 2015-11-20 NOTE — Patient Instructions (Addendum)
Call if not improved and ortho referral will be made Take anti-inflammatory Use the brace  Okay to use dayquil or mucinex DM Fluids rest         Lateral Epicondylitis With Rehab Lateral epicondylitis involves inflammation and pain around the outer portion of the elbow. The pain is caused by inflammation of the tendons in the forearm that bring back (extend) the wrist. Lateral epicondylitis is also called tennis elbow, because it is very common in tennis players. However, it may occur in any individual who extends the wrist repetitively. If lateral epicondylitis is left untreated, it may become a chronic problem. SYMPTOMS   Pain, tenderness, and inflammation on the outer (lateral) side of the elbow.  Pain or weakness with gripping activities.  Pain that increases with wrist-twisting motions (playing tennis, using a screwdriver, opening a door or a jar).  Pain with lifting objects, including a coffee cup. CAUSES  Lateral epicondylitis is caused by inflammation of the tendons that extend the wrist. Causes of injury may include:  Repetitive stress and strain on the muscles and tendons that extend the wrist.  Sudden change in activity level or intensity.  Incorrect grip in racquet sports.  Incorrect grip size of racquet (often too large).  Incorrect hitting position or technique (usually backhand, leading with the elbow).  Using a racket that is too heavy. RISK INCREASES WITH:  Sports or occupations that require repetitive and/or strenuous forearm and wrist movements (tennis, squash, racquetball, carpentry).  Poor wrist and forearm strength and flexibility.  Failure to warm up properly before activity.  Resuming activity before healing, rehabilitation, and conditioning are complete. PREVENTION   Warm up and stretch properly before activity.  Maintain physical fitness:  Strength, flexibility, and endurance.  Cardiovascular fitness.  Wear and use properly fitted  equipment.  Learn and use proper technique and have a coach correct improper technique.  Wear a tennis elbow (counterforce) brace. PROGNOSIS  The course of this condition depends on the degree of the injury. If treated properly, acute cases (symptoms lasting less than 4 weeks) are often resolved in 2 to 6 weeks. Chronic (longer lasting cases) often resolve in 3 to 6 months but may require physical therapy. RELATED COMPLICATIONS   Frequently recurring symptoms, resulting in a chronic problem. Properly treating the problem the first time decreases frequency of recurrence.  Chronic inflammation, scarring tendon degeneration, and partial tendon tear, requiring surgery.  Delayed healing or resolution of symptoms. TREATMENT  Treatment first involves the use of ice and medicine to reduce pain and inflammation. Strengthening and stretching exercises may help reduce discomfort if performed regularly. These exercises may be performed at home if the condition is an acute injury. Chronic cases may require a referral to a physical therapist for evaluation and treatment. Your caregiver may advise a corticosteroid injection to help reduce inflammation. Rarely, surgery is needed. MEDICATION  If pain medicine is needed, nonsteroidal anti-inflammatory medicines (aspirin and ibuprofen), or other minor pain relievers (acetaminophen), are often advised.  Do not take pain medicine for 7 days before surgery.  Prescription pain relievers may be given, if your caregiver thinks they are needed. Use only as directed and only as much as you need.  Corticosteroid injections may be recommended. These injections should be reserved only for the most severe cases, because they can only be given a certain number of times. HEAT AND COLD  Cold treatment (icing) should be applied for 10 to 15 minutes every 2 to 3 hours for inflammation and pain,  and immediately after activity that aggravates your symptoms. Use ice packs or an  ice massage.  Heat treatment may be used before performing stretching and strengthening activities prescribed by your caregiver, physical therapist, or athletic trainer. Use a heat pack or a warm water soak. SEEK MEDICAL CARE IF: Symptoms get worse or do not improve in 2 weeks, despite treatment. EXERCISES  - REPEAT 10 TIMES, DO THEM TWICE A DAY  RANGE OF MOTION (ROM) AND STRETCHING EXERCISES - Epicondylitis, Lateral (Tennis Elbow) These exercises may help you when beginning to rehabilitate your injury. Your symptoms may go away with or without further involvement from your physician, physical therapist, or athletic trainer. While completing these exercises, remember:   Restoring tissue flexibility helps normal motion to return to the joints. This allows healthier, less painful movement and activity.  An effective stretch should be held for at least 30 seconds.  A stretch should never be painful. You should only feel a gentle lengthening or release in the stretched tissue. RANGE OF MOTION - Wrist Flexion, Active-Assisted  Extend your right / left elbow with your fingers pointing down.*  Gently pull the back of your hand towards you, until you feel a gentle stretch on the top of your forearm.  Hold this position for __________ seconds. Repeat __________ times. Complete this exercise __________ times per day.  *If directed by your physician, physical therapist or athletic trainer, complete this stretch with your elbow bent, rather than extended. RANGE OF MOTION - Wrist Extension, Active-Assisted  Extend your right / left elbow and turn your palm upwards.*  Gently pull your palm and fingertips back, so your wrist extends and your fingers point more toward the ground.  You should feel a gentle stretch on the inside of your forearm.  Hold this position for __________ seconds. Repeat __________ times. Complete this exercise __________ times per day. *If directed by your physician, physical  therapist or athletic trainer, complete this stretch with your elbow bent, rather than extended. STRETCH - Wrist Flexion  Place the back of your right / left hand on a tabletop, leaving your elbow slightly bent. Your fingers should point away from your body.  Gently press the back of your hand down onto the table by straightening your elbow. You should feel a stretch on the top of your forearm.  Hold this position for __________ seconds. Repeat __________ times. Complete this stretch __________ times per day.  STRETCH - Wrist Extension   Place your right / left fingertips on a tabletop, leaving your elbow slightly bent. Your fingers should point backwards.  Gently press your fingers and palm down onto the table by straightening your elbow. You should feel a stretch on the inside of your forearm.  Hold this position for __________ seconds. Repeat __________ times. Complete this stretch __________ times per day.  STRENGTHENING EXERCISES - Epicondylitis, Lateral (Tennis Elbow) These exercises may help you when beginning to rehabilitate your injury. They may resolve your symptoms with or without further involvement from your physician, physical therapist, or athletic trainer. While completing these exercises, remember:   Muscles can gain both the endurance and the strength needed for everyday activities through controlled exercises.  Complete these exercises as instructed by your physician, physical therapist or athletic trainer. Increase the resistance and repetitions only as guided.  You may experience muscle soreness or fatigue, but the pain or discomfort you are trying to eliminate should never worsen during these exercises. If this pain does get worse, stop and make  sure you are following the directions exactly. If the pain is still present after adjustments, discontinue the exercise until you can discuss the trouble with your caregiver. STRENGTH - Wrist Flexors  Sit with your right /  left forearm palm-up and fully supported on a table or countertop. Your elbow should be resting below the height of your shoulder. Allow your wrist to extend over the edge of the surface.  Loosely holding a __________ weight, or a piece of rubber exercise band or tubing, slowly curl your hand up toward your forearm.  Hold this position for __________ seconds. Slowly lower the wrist back to the starting position in a controlled manner. Repeat __________ times. Complete this exercise __________ times per day.  STRENGTH - Wrist Extensors  Sit with your right / left forearm palm-down and fully supported on a table or countertop. Your elbow should be resting below the height of your shoulder. Allow your wrist to extend over the edge of the surface.  Loosely holding a __________ weight, or a piece of rubber exercise band or tubing, slowly curl your hand up toward your forearm.  Hold this position for __________ seconds. Slowly lower the wrist back to the starting position in a controlled manner. Repeat __________ times. Complete this exercise __________ times per day.  STRENGTH - Ulnar Deviators  Stand with a ____________________ weight in your right / left hand, or sit while holding a rubber exercise band or tubing, with your healthy arm supported on a table or countertop.  Move your wrist, so that your pinkie travels toward your forearm and your thumb moves away from your forearm.  Hold this position for __________ seconds and then slowly lower the wrist back to the starting position. Repeat __________ times. Complete this exercise __________ times per day STRENGTH - Radial Deviators  Stand with a ____________________ weight in your right / left hand, or sit while holding a rubber exercise band or tubing, with your injured arm supported on a table or countertop.  Raise your hand upward in front of you or pull up on the rubber tubing.  Hold this position for __________ seconds and then slowly  lower the wrist back to the starting position. Repeat __________ times. Complete this exercise __________ times per day. STRENGTH - Forearm Supinators   Sit with your right / left forearm supported on a table, keeping your elbow below shoulder height. Rest your hand over the edge, palm down.  Gently grip a hammer or a soup ladle.  Without moving your elbow, slowly turn your palm and hand upward to a "thumbs-up" position.  Hold this position for __________ seconds. Slowly return to the starting position. Repeat __________ times. Complete this exercise __________ times per day.  STRENGTH - Forearm Pronators   Sit with your right / left forearm supported on a table, keeping your elbow below shoulder height. Rest your hand over the edge, palm up.  Gently grip a hammer or a soup ladle.  Without moving your elbow, slowly turn your palm and hand upward to a "thumbs-up" position.  Hold this position for __________ seconds. Slowly return to the starting position. Repeat __________ times. Complete this exercise __________ times per day.  STRENGTH - Grip  Grasp a tennis ball, a dense sponge, or a large, rolled sock in your hand.  Squeeze as hard as you can, without increasing any pain.  Hold this position for __________ seconds. Release your grip slowly. Repeat __________ times. Complete this exercise __________ times per day.  STRENGTH -  Elbow Extensors, Isometric  Stand or sit upright, on a firm surface. Place your right / left arm so that your palm faces your stomach, and it is at the height of your waist.  Place your opposite hand on the underside of your forearm. Gently push up as your right / left arm resists. Push as hard as you can with both arms, without causing any pain or movement at your right / left elbow. Hold this stationary position for __________ seconds. Gradually release the tension in both arms. Allow your muscles to relax completely before repeating.   This information  is not intended to replace advice given to you by your health care provider. Make sure you discuss any questions you have with your health care provider.   Document Released: 01/31/2005 Document Revised: 02/21/2014 Document Reviewed: 05/15/2008 Elsevier Interactive Patient Education [employer]

## 2015-12-01 ENCOUNTER — Other Ambulatory Visit: Payer: Self-pay | Admitting: Family Medicine

## 2015-12-31 DIAGNOSIS — J029 Acute pharyngitis, unspecified: Secondary | ICD-10-CM | POA: Diagnosis not present

## 2016-02-01 ENCOUNTER — Telehealth: Payer: Self-pay | Admitting: Family Medicine

## 2016-02-01 NOTE — Telephone Encounter (Signed)
Patient dropped off form for Dr. Tanya NonesPickard to sign and fax. I have placed document in Sandy's yellow folder.He is requesting this to be filled out today it is due by tomorrow. I advised him that it was normally 5 days for forms.  CB# [phone]

## 2016-02-02 NOTE — Telephone Encounter (Signed)
Received paperwork at my desk at 8:36 AM

## 2016-02-02 NOTE — Telephone Encounter (Signed)
Forms filled out to my ability and placed in Dr. Tanya NonesPickard 24 hour folder (green) to finish

## 2016-02-03 DIAGNOSIS — R300 Dysuria: Secondary | ICD-10-CM | POA: Diagnosis not present

## 2016-02-03 DIAGNOSIS — Z113 Encounter for screening for infections with a predominantly sexual mode of transmission: Secondary | ICD-10-CM | POA: Diagnosis not present

## 2016-02-04 NOTE — Telephone Encounter (Signed)
Forms completed and faxed.  

## 2016-02-04 NOTE — Telephone Encounter (Signed)
Received confirmation.

## 2016-02-12 ENCOUNTER — Telehealth: Payer: Self-pay | Admitting: Family Medicine

## 2016-02-12 NOTE — Telephone Encounter (Signed)
We are slammed right now.  PLease call and get his question or he can have an appt next week.  I won't be able to call him today.

## 2016-02-12 NOTE — Telephone Encounter (Signed)
Patient calling to speak with you regarding something personal, wanted an appt today, but your schedule is full [phone]

## 2016-02-12 NOTE — Telephone Encounter (Signed)
Call placed to patient.   Advised that MD is not able to return call at this time, and inquired as to how I can help.   Patient states, "It's not that big a deal. I'll call him next week."  MD to be made aware.

## 2016-04-01 LAB — INFLUENZA A AND B AG, IMMUNOASSAY
INFLUENZA A ANTIGEN: NOT DETECTED
INFLUENZA B ANTIGEN: NOT DETECTED

## 2016-06-07 ENCOUNTER — Ambulatory Visit (INDEPENDENT_AMBULATORY_CARE_PROVIDER_SITE_OTHER): Payer: BLUE CROSS/BLUE SHIELD | Admitting: Family Medicine

## 2016-06-07 ENCOUNTER — Encounter: Payer: Self-pay | Admitting: Family Medicine

## 2016-06-07 VITALS — BP 116/70 | HR 60 | Temp 98.7°F | Resp 16 | Ht 72.0 in | Wt 202.0 lb

## 2016-06-07 DIAGNOSIS — M25562 Pain in left knee: Secondary | ICD-10-CM | POA: Diagnosis not present

## 2016-06-07 DIAGNOSIS — M7712 Lateral epicondylitis, left elbow: Secondary | ICD-10-CM

## 2016-06-07 MED ORDER — DICLOFENAC SODIUM 75 MG PO TBEC: 75.0000 mg | DELAYED_RELEASE_TABLET | Freq: Two times a day (BID) | ORAL | 0 refills | Status: DC

## 2016-06-07 NOTE — Patient Instructions (Signed)
Schedule a physical when you can with Dr. Tanya Nones  Referral to orthopedics  Take anti-inflammatory

## 2016-06-07 NOTE — Progress Notes (Signed)
   Subjective:    Patient ID: Samuel Cunningham, male    DOB: October 21, 1990, 26 y.o.   MRN: [id number]  Patient presents for L Knee Pain (x1 month- reports increased physical activity and noted pain and popping in knee joint- states that he has been taking glucosamine and notes slight improvement) and L Elbow Pain (x1 month- tennis elbow)    She had with left elbow pain he is pointing to the lateral epicondyle. This isn't present for the past few weeks. No particular injury. He is actually had right tennis elbow in the past which resolved with anti-inflammatories. He has been taking tumor and glucosamine. He's also had left knee pain for the past month. He runs about 5 miles a day on top of his regular exercise routine but the gym. He denies any particular injury but did play a lot of sports as a teenager. He denies any effusion or swelling he feels a popping and cracking sound. It is worse after he has been sitting for a while He does Not having pain while he is running      Review Of Systems:  GEN- denies fatigue, fever, weight loss,weakness, recent illness HEENT- denies eye drainage, change in vision, nasal discharge, CVS- denies chest pain, palpitations RESP- denies SOB, cough, wheeze ABD- denies N/V, change in stools, abd pain GU- denies dysuria, hematuria, dribbling, incontinence MSK- +joint pain, muscle aches, injury Neuro- denies headache, dizziness, syncope, seizure activity       Objective:    BP 116/70   Pulse 60   Temp 98.7 F (37.1 C) (Oral)   Resp 16   Ht 6' (1.829 m)   Wt 202 lb (91.6 kg)   SpO2 98%   BMI 27.40 kg/m  GEN- NAD, alert and oriented x3  MSK- bilat knee normal inspection, good ROM, left knee + crepitus, ligmanents in tact, ankle FROM  Left elbow/arm normal apperance, TTP over lateral epicondyle, mild pain with resistance to ROM  FROM wrist  Pulse- radial 2+         Assessment & Plan:      Problem List Items Addressed This Visit    None     Visit Diagnoses    Left knee pain, unspecified chronicity    -  Primary   Refer to ortho, decrease miles of running for now, diclofenac given   Lateral epicondylitis of left elbow       He requested shot, states severe, refer to ortho, start NSAIDS, ICE, can use elbow wrap   Relevant Medications   diclofenac (VOLTAREN) 75 MG EC tablet      Note: This dictation was prepared with Dragon dictation along with smaller phrase technology. Any transcriptional errors that result from this process are unintentional.

## 2016-06-16 ENCOUNTER — Ambulatory Visit (INDEPENDENT_AMBULATORY_CARE_PROVIDER_SITE_OTHER): Payer: Self-pay

## 2016-06-16 ENCOUNTER — Ambulatory Visit (INDEPENDENT_AMBULATORY_CARE_PROVIDER_SITE_OTHER): Payer: BLUE CROSS/BLUE SHIELD | Admitting: Orthopaedic Surgery

## 2016-06-16 ENCOUNTER — Encounter (INDEPENDENT_AMBULATORY_CARE_PROVIDER_SITE_OTHER): Payer: Self-pay | Admitting: Orthopaedic Surgery

## 2016-06-16 DIAGNOSIS — M25562 Pain in left knee: Secondary | ICD-10-CM | POA: Diagnosis not present

## 2016-06-16 DIAGNOSIS — M25522 Pain in left elbow: Secondary | ICD-10-CM

## 2016-06-16 DIAGNOSIS — G8929 Other chronic pain: Secondary | ICD-10-CM

## 2016-06-16 NOTE — Progress Notes (Signed)
   Office Visit Note   Patient: Samuel RenoMichael C Mikkelsen           Date of Birth: [birth year]           MRN: [id number] Visit Date: 06/16/2016              Requested by: Salley ScarletKawanta F Blaine, MD [address] [place], [place] [id number] PCP: Leo GrosserPICKARD,WARREN TOM, MD   Assessment & Plan: Visit Diagnoses:  1. Pain in left elbow   2. Chronic pain of left knee     Plan: Patient has patellofemoral pain. I gave him How to Decrease the Symptoms. He Needs to Rest Ice and Take Diclofenac As Needed. Knee Sleeve Was Given. He Needs to Slow down on the Running for Now. Follow-Up with Me As Needed.  Follow-Up Instructions: Return if symptoms worsen or fail to improve.   Orders:  Orders Placed This Encounter  Procedures  . XR Knee 1-2 Views Left   No orders of the defined types were placed in this encounter.     Procedures: No procedures performed   Clinical Data: No additional findings.   Subjective: Chief Complaint  Patient presents with  . Left Knee - Pain  . Left Elbow - Pain    Patient is a healthy 26 year old gentleman who comes mainly complaining of left knee pain around the patella. It's worse with flexion of the knee with running. He does run a day. Is hurting weeks now he denies any mechanical symptoms. He has taken diclofenac relief. Denies any constitutional symptoms. His left elbow does not really bother him that badly.    Review of Systems  Constitutional: Negative.   All other systems reviewed and are negative.    Objective: Vital Signs: There were no vitals taken for this visit.  Physical Exam  Constitutional: He is oriented to person, place, and time. He appears well-developed and well-nourished.  HENT:  Head: Normocephalic and atraumatic.  Eyes: Pupils are equal, round, and reactive to light.  Neck: Neck supple.  Pulmonary/Chest: Effort normal.  Abdominal: Soft.  Musculoskeletal: Normal range of motion.  Neurological: He is alert and oriented to person, place,  and time.  Skin: Skin is warm.  Psychiatric: He has a normal mood and affect. His behavior is normal. Judgment and thought content normal.  Nursing note and vitals reviewed.   Ortho Exam Left elbow exam shows positive pain with resisted ECRB testing. He has mild tenderness of the lateral epicondyle Left knee exam shows no joint effusion. Essentially benign exam. Minimal patellar crepitus. Specialty Comments:  No specialty comments available.  Imaging: Xr Knee 1-2 Views Left  Result Date: 06/16/2016 negative    PMFS History: Patient Active Problem List   Diagnosis Date Noted  . Allergy    Past Medical History:  Diagnosis Date  . Allergy   . Anxiety     Family History  Problem Relation Age of Onset  . Hypertension Mother   . Mental illness Brother     anxiety    Past Surgical History:  Procedure Laterality Date  . REPAIR ANKLE LIGAMENT     Social History   Occupational History  . Not on file.   Social History Main Topics  . Smoking status: Passive Smoke Exposure - Never Smoker  . Smokeless tobacco: Current User    Types: Chew  . Alcohol use Yes  . Drug use: No  . Sexual activity: Not on file

## 2016-07-15 ENCOUNTER — Ambulatory Visit (INDEPENDENT_AMBULATORY_CARE_PROVIDER_SITE_OTHER): Payer: BLUE CROSS/BLUE SHIELD | Admitting: Family Medicine

## 2016-07-15 VITALS — BP 100/60 | HR 60 | Temp 98.1°F | Resp 14 | Ht 72.0 in | Wt 208.0 lb

## 2016-07-15 DIAGNOSIS — F411 Generalized anxiety disorder: Secondary | ICD-10-CM | POA: Diagnosis not present

## 2016-07-15 MED ORDER — ESCITALOPRAM OXALATE 10 MG PO TABS: 10.0000 mg | ORAL_TABLET | Freq: Every day | ORAL | 3 refills | Status: DC

## 2016-07-15 NOTE — Progress Notes (Signed)
   Subjective:    Patient ID: Samuel Cunningham, male    DOB: 10/21/1990, 26 y.o.   MRN: [id number]  HPI Patient has a history of generalized anxiety disorder. He was taking Lexapro for the last 2 years approximately for prevention of panic attacks and was doing extremely well on the medication. Independently, 2 months ago, he decided to wean himself off the medication. He did well for approximately one month. However now he is starting to have a recurrence of anxiety and panic attacks. He also reports depression, trouble focusing, trouble concentrating and mood swings. He reports feeling sad. He denies any suicidal ideation or homicidal ideation or delusions or mania. Past Medical History:  Diagnosis Date  . Allergy   . Anxiety    Past Surgical History:  Procedure Laterality Date  . REPAIR ANKLE LIGAMENT     No current outpatient prescriptions on file prior to visit.   No current facility-administered medications on file prior to visit.    Allergies  Allergen Reactions  . Codeine Itching and Nausea Only   Social History   Social History  . Marital status: Single    Spouse name: N/A  . Number of children: N/A  . Years of education: N/A   Occupational History  . Not on file.   Social History Main Topics  . Smoking status: Passive Smoke Exposure - Never Smoker  . Smokeless tobacco: Current User    Types: Chew  . Alcohol use Yes  . Drug use: No  . Sexual activity: Not on file   Other Topics Concern  . Not on file   Social History Narrative  . No narrative on file         Review of Systems  All other systems reviewed and are negative.      Objective:   Physical Exam  Constitutional: He appears well-developed and well-nourished. No distress.  Cardiovascular: Normal rate, regular rhythm and normal heart sounds.   Pulmonary/Chest: Effort normal and breath sounds normal.  Neurological: He displays normal reflexes.  Skin: He is not diaphoretic.  Psychiatric: He  has a normal mood and affect. His behavior is normal. Judgment and thought content normal.  Vitals reviewed.         Assessment & Plan:  GAD (generalized anxiety disorder)  Patient was tolerating Lexapro well without any side effects. He states that his quality of life was very good on the medication. He only discontinued the medication because he did not want to feel dependent on taking the medicine. Now 2 months after discontinuing the medication, his problems are returning. He is having daily anxiety, depression, poor concentration, and mood swings. Therefore I recommended that he resume the medication 10 mg once a day and reassess in 4-6 weeks

## 2016-11-13 DIAGNOSIS — N390 Urinary tract infection, site not specified: Secondary | ICD-10-CM | POA: Diagnosis not present

## 2016-11-13 DIAGNOSIS — F419 Anxiety disorder, unspecified: Secondary | ICD-10-CM | POA: Diagnosis not present

## 2016-11-13 DIAGNOSIS — R350 Frequency of micturition: Secondary | ICD-10-CM | POA: Diagnosis not present

## 2016-11-17 ENCOUNTER — Emergency Department
Admission: EM | Admit: 2016-11-17 | Discharge: 2016-11-17 | Disposition: A | Discharge status: Home / Self Care | Payer: BLUE CROSS/BLUE SHIELD | Attending: Emergency Medicine | Admitting: Emergency Medicine

## 2016-11-17 ENCOUNTER — Emergency Department: Payer: BLUE CROSS/BLUE SHIELD

## 2016-11-17 ENCOUNTER — Encounter: Payer: Self-pay | Admitting: Emergency Medicine

## 2016-11-17 DIAGNOSIS — F17220 Nicotine dependence, chewing tobacco, uncomplicated: Secondary | ICD-10-CM | POA: Diagnosis not present

## 2016-11-17 DIAGNOSIS — M79641 Pain in right hand: Secondary | ICD-10-CM | POA: Diagnosis not present

## 2016-11-17 DIAGNOSIS — Y999 Unspecified external cause status: Secondary | ICD-10-CM | POA: Insufficient documentation

## 2016-11-17 DIAGNOSIS — Y92410 Unspecified street and highway as the place of occurrence of the external cause: Secondary | ICD-10-CM | POA: Insufficient documentation

## 2016-11-17 DIAGNOSIS — S2231XA Fracture of one rib, right side, initial encounter for closed fracture: Secondary | ICD-10-CM

## 2016-11-17 DIAGNOSIS — Z79899 Other long term (current) drug therapy: Secondary | ICD-10-CM | POA: Diagnosis not present

## 2016-11-17 DIAGNOSIS — S63621A Sprain of interphalangeal joint of right thumb, initial encounter: Secondary | ICD-10-CM | POA: Diagnosis not present

## 2016-11-17 DIAGNOSIS — Y9389 Activity, other specified: Secondary | ICD-10-CM | POA: Insufficient documentation

## 2016-11-17 DIAGNOSIS — R52 Pain, unspecified: Secondary | ICD-10-CM | POA: Diagnosis not present

## 2016-11-17 DIAGNOSIS — S20211A Contusion of right front wall of thorax, initial encounter: Secondary | ICD-10-CM

## 2016-11-17 DIAGNOSIS — S299XXA Unspecified injury of thorax, initial encounter: Secondary | ICD-10-CM | POA: Diagnosis present

## 2016-11-17 DIAGNOSIS — R079 Chest pain, unspecified: Secondary | ICD-10-CM | POA: Diagnosis not present

## 2016-11-17 MED ORDER — MELOXICAM 15 MG PO TABS: 15.0000 mg | ORAL_TABLET | Freq: Every day | ORAL | 0 refills | Status: DC

## 2016-11-17 NOTE — ED Notes (Signed)
Pt was ambulatory with s/o to the POV. NAD, VSS. No concerns or needs voiced.
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
## 2016-11-17 NOTE — ED Notes (Signed)
Spoke with Dr. Lamont Snowball in regards to patient presentation. See orders.
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
## 2017-04-27 ENCOUNTER — Other Ambulatory Visit: Payer: Self-pay | Admitting: Family Medicine

## 2017-04-27 MED ORDER — ESCITALOPRAM OXALATE 10 MG PO TABS: 10.0000 mg | ORAL_TABLET | Freq: Every day | ORAL | 3 refills | Status: DC

## 2017-07-04 DIAGNOSIS — F411 Generalized anxiety disorder: Secondary | ICD-10-CM | POA: Diagnosis not present

## 2017-09-11 ENCOUNTER — Ambulatory Visit: Payer: BLUE CROSS/BLUE SHIELD | Admitting: Psychology

## 2017-09-15 DIAGNOSIS — R300 Dysuria: Secondary | ICD-10-CM | POA: Diagnosis not present

## 2017-09-15 DIAGNOSIS — Z7721 Contact with and (suspected) exposure to potentially hazardous body fluids: Secondary | ICD-10-CM | POA: Diagnosis not present

## 2017-09-26 ENCOUNTER — Ambulatory Visit: Payer: Self-pay | Admitting: Psychology

## 2017-10-17 ENCOUNTER — Telehealth: Payer: Self-pay | Admitting: Family Medicine

## 2017-10-17 MED ORDER — ESCITALOPRAM OXALATE 10 MG PO TABS: 10.0000 mg | ORAL_TABLET | Freq: Every day | ORAL | 3 refills | Status: DC

## 2017-10-17 NOTE — Telephone Encounter (Signed)
Medication called/sent to requested pharmacy  

## 2017-10-17 NOTE — Telephone Encounter (Signed)
Refill on lexapro to [facility] [address].

## 2017-10-19 DIAGNOSIS — Z202 Contact with and (suspected) exposure to infections with a predominantly sexual mode of transmission: Secondary | ICD-10-CM | POA: Diagnosis not present

## 2017-11-17 ENCOUNTER — Ambulatory Visit (INDEPENDENT_AMBULATORY_CARE_PROVIDER_SITE_OTHER): Payer: BLUE CROSS/BLUE SHIELD | Admitting: Psychology

## 2017-11-17 DIAGNOSIS — F1020 Alcohol dependence, uncomplicated: Secondary | ICD-10-CM

## 2017-12-15 ENCOUNTER — Ambulatory Visit: Payer: BLUE CROSS/BLUE SHIELD | Admitting: Psychology

## 2017-12-15 DIAGNOSIS — F1020 Alcohol dependence, uncomplicated: Secondary | ICD-10-CM

## 2018-01-19 ENCOUNTER — Ambulatory Visit: Payer: BLUE CROSS/BLUE SHIELD | Admitting: Psychology

## 2018-01-19 DIAGNOSIS — F1020 Alcohol dependence, uncomplicated: Secondary | ICD-10-CM

## 2018-02-12 DIAGNOSIS — S39012A Strain of muscle, fascia and tendon of lower back, initial encounter: Secondary | ICD-10-CM | POA: Diagnosis not present

## 2018-02-12 DIAGNOSIS — J069 Acute upper respiratory infection, unspecified: Secondary | ICD-10-CM | POA: Diagnosis not present

## 2018-02-23 ENCOUNTER — Ambulatory Visit: Payer: BLUE CROSS/BLUE SHIELD | Admitting: Psychology

## 2018-02-23 DIAGNOSIS — F1020 Alcohol dependence, uncomplicated: Secondary | ICD-10-CM | POA: Diagnosis not present

## 2018-03-15 ENCOUNTER — Ambulatory Visit: Payer: BLUE CROSS/BLUE SHIELD | Admitting: Psychology

## 2018-03-16 ENCOUNTER — Ambulatory Visit: Payer: BLUE CROSS/BLUE SHIELD | Admitting: Psychology

## 2018-03-22 ENCOUNTER — Ambulatory Visit: Payer: BLUE CROSS/BLUE SHIELD | Admitting: Psychology

## 2018-03-23 ENCOUNTER — Ambulatory Visit: Payer: BLUE CROSS/BLUE SHIELD | Admitting: Psychology
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
## 2018-04-23 DIAGNOSIS — J189 Pneumonia, unspecified organism: Secondary | ICD-10-CM | POA: Diagnosis not present

## 2018-05-16 ENCOUNTER — Other Ambulatory Visit: Payer: Self-pay

## 2018-05-16 ENCOUNTER — Ambulatory Visit (INDEPENDENT_AMBULATORY_CARE_PROVIDER_SITE_OTHER): Payer: BLUE CROSS/BLUE SHIELD | Admitting: Physician Assistant

## 2018-05-16 ENCOUNTER — Encounter: Payer: Self-pay | Admitting: Physician Assistant

## 2018-05-16 DIAGNOSIS — J22 Unspecified acute lower respiratory infection: Secondary | ICD-10-CM | POA: Diagnosis not present

## 2018-05-16 DIAGNOSIS — R053 Chronic cough: Secondary | ICD-10-CM | POA: Insufficient documentation

## 2018-05-16 DIAGNOSIS — R062 Wheezing: Secondary | ICD-10-CM | POA: Diagnosis not present

## 2018-05-16 DIAGNOSIS — R05 Cough: Secondary | ICD-10-CM

## 2018-05-16 MED ORDER — LEVOFLOXACIN 750 MG PO TABS: 750.0000 mg | ORAL_TABLET | Freq: Every day | ORAL | 0 refills | Status: AC

## 2018-05-16 MED ORDER — BUDESONIDE-FORMOTEROL FUMARATE 80-4.5 MCG/ACT IN AERO: 2.0000 | INHALATION_SPRAY | Freq: Two times a day (BID) | RESPIRATORY_TRACT | 0 refills | Status: DC

## 2018-05-16 NOTE — Progress Notes (Signed)
Virtual Visit via Video Note  I connected with Samuel Cunningham on 05/16/18 at  8:30 AM EDT by a video enabled telemedicine application and verified that I am speaking with the correct person using two identifiers.   I discussed the limitations of evaluation and management by telemedicine and the availability of in person appointments. The patient expressed understanding and agreed to proceed.  History of Present Illness: Samuel Cunningham is a 28 yo M with PMH of tobacco use who presents with approximately 10 weeks of cough and wheezing. Reports he was diagnosed with influenza B in early January and treated with Tamiflu. Reports he had a persistent cough following treatment and presented to an urgent care where he had chest x-ray and was treated for RLL CAP with Azithromycin and Rocephin on 04/23/18. Reports he felt a little improved, but symptoms never fully resolved. He continues to have persistent wheezing and intermittent cough occasionally productive of yellow sputum His partner is a PA and reports diffuse rhonchi when she ausculates his lungs at home.  Symtpoms are worse first thing in the morning and when he lays down at night Symptoms are relieved by Albuterol inhaler Denies fever, chills, malaise, blood-tinged sputum, chest pain or dyspnea. Reports exposure to wood dust and noxious fumes at work No prior history of asthma or pulmonary disease    Past Medical History:  Diagnosis Date  . Allergy   . Anxiety    Past Surgical History:  Procedure Laterality Date  . REPAIR ANKLE LIGAMENT     Social History   Tobacco Use  . Smoking status: Passive Smoke Exposure - Never Smoker  . Smokeless tobacco: Current User    Types: Chew  Substance Use Topics  . Alcohol use: Yes   family history includes Hypertension in his mother; Mental illness in his brother.    ROS: negative except as noted in the HPI  Medications: Current Outpatient Medications  Medication Sig Dispense Refill  .  budesonide-formoterol (SYMBICORT) 80-4.5 MCG/ACT inhaler Inhale 2 puffs into the lungs 2 (two) times daily for 14 days. Brush teeth and rinse mouth after use 1 Inhaler 0  . escitalopram (LEXAPRO) 10 MG tablet Take 1 tablet (10 mg total) by mouth daily. 90 tablet 3  . levofloxacin (LEVAQUIN) 750 MG tablet Take 1 tablet (750 mg total) by mouth daily for 5 days. 5 tablet 0  . PROAIR HFA 108 (90 Base) MCG/ACT inhaler INHALE 1 TO 2 PUFFS EVERY 4 TO 6 HOURS AS NEEDED FOR COUGH OR SHORTNESS OF BREATH     No current facility-administered medications for this visit.    Allergies  Allergen Reactions  . Codeine Itching and Nausea Only       Objective:  There were no vitals taken for this visit. Gen:  alert, not ill-appearing, no distress, appropriate for age HEENT: head normocephalic without obvious abnormality, conjunctiva and cornea clear, trachea midline Pulm: Normal work of breathing, normal phonation, speaking in full sentences    Assessment and Plan: 28 y.o. male with   .Diagnoses and all orders for this visit:  Persistent cough for 3 weeks or longer  Wheezing -     budesonide-formoterol (SYMBICORT) 80-4.5 MCG/ACT inhaler; Inhale 2 puffs into the lungs 2 (two) times daily for 14 days. Brush teeth and rinse mouth after use  Acute lower respiratory tract infection -     levofloxacin (LEVAQUIN) 750 MG tablet; Take 1 tablet (750 mg total) by mouth daily for 5 days.   Patient appears well on video  conferencing, respirations are unlabored, speaking in full sentences Treating empirically for CAP with Levaquin (failed Azithromycin) Also starting Symbicort 2 puffs bid for 2 weeks for suspected reactive airway disease Cont Albuterol 2 puffs Q4H prn Recommended he remaining out of work for duration of antibiotic therapy. May return Monday if clinically improved  I discussed the assessment and treatment plan with the patient. The patient was provided an opportunity to ask questions and all  were answered. The patient agreed with the plan and demonstrated an understanding of the instructions.   The patient was advised to call back or seek an in-person evaluation if the symptoms worsen or if the condition fails to improve as anticipated.  I provided 15 minutes of non-face-to-face time during this encounter.   Carlis Stable, [place]

## 2018-05-28 ENCOUNTER — Ambulatory Visit (INDEPENDENT_AMBULATORY_CARE_PROVIDER_SITE_OTHER): Payer: BLUE CROSS/BLUE SHIELD | Admitting: Family Medicine

## 2018-05-28 ENCOUNTER — Other Ambulatory Visit: Payer: Self-pay

## 2018-05-28 DIAGNOSIS — R053 Chronic cough: Secondary | ICD-10-CM

## 2018-05-28 DIAGNOSIS — R05 Cough: Secondary | ICD-10-CM

## 2018-05-28 MED ORDER — LEVOCETIRIZINE DIHYDROCHLORIDE 5 MG PO TABS: 5.0000 mg | ORAL_TABLET | Freq: Every evening | ORAL | 0 refills | Status: DC

## 2018-05-28 MED ORDER — PREDNISONE 20 MG PO TABS: 20.0000 mg | ORAL_TABLET | Freq: Every day | ORAL | 0 refills | Status: DC

## 2018-05-28 NOTE — Progress Notes (Signed)
Subjective:    Patient ID: Samuel Cunningham, male    DOB: [birth year], 28 y.o.   MRN: [id number]  HPI Patient is being seen today by telephone.  He consents to be seen by telephone.  He is currently at home.  I am currently at my office.  Phone call began at 204.  Phone call concluded at 224.  Patient was diagnosed with "the flu" on New Year's.  He was given Tamiflu and gradually got better over a couple of weeks.  However he continued to have a cough thereafter.  He was seen at an urgent care and was diagnosed with walking pneumonia.  Per the patient he had a chest x-ray which showed a lower lobe infiltrate at the urgent care.  He was given a Z-Pak for the walking pneumonia the first time.  Gradually the cough improved however he continued to experience wheezing that did not improve.  He states that the wheezing would occur gradually at random times throughout the day usually worse with exercise.  He then went online and had an online video chat and was diagnosed with some type of walking pneumonia or bronchitis and was given Levaquin for 5 days as well as Symbicort 80/4.5, 2 puffs inhaled twice daily.  Patient states that the inhaler seems to help the wheezing at least for a little while but he continues to experience the wheezing intermittently throughout the day.  He also continues to have a cough intermittently throughout the day.  He also reports rhinorrhea and hoarse voice.  He denies any heartburn or indigestion.  He denies any hemoptysis.  He denies any fevers or chills or night sweats or weight loss.  He denies any travel.  His job involves him working outside constantly throughout the day and being exposed to pollen and seasonal allergies.  Patient states that he does not feel sick.  However he is concerned and wants to be proactive given the current environment that there is nothing wrong causing him to continue to wheeze and cough intermittently throughout the day Past Medical History:   Diagnosis Date  . Allergy   . Anxiety    Past Surgical History:  Procedure Laterality Date  . REPAIR ANKLE LIGAMENT     Current Outpatient Medications on File Prior to Visit  Medication Sig Dispense Refill  . budesonide-formoterol (SYMBICORT) 80-4.5 MCG/ACT inhaler Inhale 2 puffs into the lungs 2 (two) times daily for 14 days. Brush teeth and rinse mouth after use 1 Inhaler 0  . escitalopram (LEXAPRO) 10 MG tablet Take 1 tablet (10 mg total) by mouth daily. 90 tablet 3  . PROAIR HFA 108 (90 Base) MCG/ACT inhaler INHALE 1 TO 2 PUFFS EVERY 4 TO 6 HOURS AS NEEDED FOR COUGH OR SHORTNESS OF BREATH     No current facility-administered medications on file prior to visit.    Allergies  Allergen Reactions  . Codeine Itching and Nausea Only   Social History   Socioeconomic History  . Marital status: Single    Spouse name: Not on file  . Number of children: Not on file  . Years of education: Not on file  . Highest education level: Not on file  Occupational History  . Not on file  Social Needs  . Financial resource strain: Not on file  . Food insecurity:    Worry: Not on file    Inability: Not on file  . Transportation needs:    Medical: Not on file    Non-medical: Not  on file  Tobacco Use  . Smoking status: Passive Smoke Exposure - Never Smoker  . Smokeless tobacco: Current User    Types: Chew  Substance and Sexual Activity  . Alcohol use: Yes  . Drug use: No  . Sexual activity: Not on file  Lifestyle  . Physical activity:    Days per week: Not on file    Minutes per session: Not on file  . Stress: Not on file  Relationships  . Social connections:    Talks on phone: Not on file    Gets together: Not on file    Attends religious service: Not on file    Active member of club or organization: Not on file    Attends meetings of clubs or organizations: Not on file    Relationship status: Not on file  . Intimate partner violence:    Fear of current or ex partner: Not on  file    Emotionally abused: Not on file    Physically abused: Not on file    Forced sexual activity: Not on file  Other Topics Concern  . Not on file  Social History Narrative  . Not on file      Review of Systems  All other systems reviewed and are negative.      Objective:   Physical Exam  No physical exam was performed as the patient was seen via telephone      Assessment & Plan:  Persistent cough for 3 weeks or longer  Chronic cough - Plan: predniSONE (DELTASONE) 20 MG tablet, levocetirizine (XYZAL) 5 MG tablet  Patient has had a chronic cough ever since he was diagnosed with the flu in January.  Whether it was the flu or coronavirus in January, I believe the initial infection has long since passed.  He has been treated on 2 separate occasions for walking pneumonia and bronchitis with a Z-Pak which should have covered whooping cough as well as Levaquin.  He is even now on a long-acting bronchodilator in the form of Symbicort.  I believe that the infection likely triggered a bronchitis with inflammation in the airways.  However I believe the infection has now passed but rather an irritant is continuing to irritate the airways causing bronchospasm and wheezing and cough.  This irritant could be asthma that is poorly controlled, allergies, or some type of chemical irritants such as acid reflux.  Based on his rhinorrhea and hoarseness, I think the majority of his symptoms are due to allergies.  Therefore I recommended that he continue the Symbicort but I will add prednisone 60 mg a day for 5 days.  I will also start him on Xyzal 5 mg a day and then reassess the patient in 1 week.  If the cough is improving, it would point towards allergies exacerbating asthma as the cause of his chronic cough and wheezing.  If the cough is not improving at that time, I would recommend further imaging of the lungs and adding a proton pump inhibitor for possible laryngo-esophageal reflux.  Recheck sooner  if worse

## 2018-06-21 ENCOUNTER — Telehealth: Payer: Self-pay | Admitting: Family Medicine

## 2018-06-21 DIAGNOSIS — R062 Wheezing: Secondary | ICD-10-CM

## 2018-06-21 DIAGNOSIS — R05 Cough: Secondary | ICD-10-CM

## 2018-06-21 DIAGNOSIS — R053 Chronic cough: Secondary | ICD-10-CM

## 2018-06-21 NOTE — Telephone Encounter (Signed)
ok 

## 2018-06-21 NOTE — Telephone Encounter (Signed)
Referral orders placed

## 2018-06-21 NOTE — Telephone Encounter (Signed)
pts mother called wanting to know if we could get a referral for a pulmonologist, pt recently had a telephone visit with doctor pickard.

## 2018-06-25 ENCOUNTER — Ambulatory Visit (INDEPENDENT_AMBULATORY_CARE_PROVIDER_SITE_OTHER): Payer: BLUE CROSS/BLUE SHIELD | Admitting: Family Medicine

## 2018-06-25 ENCOUNTER — Encounter: Payer: Self-pay | Admitting: Family Medicine

## 2018-06-25 ENCOUNTER — Other Ambulatory Visit: Payer: Self-pay

## 2018-06-25 VITALS — BP 118/76 | HR 60 | Temp 98.7°F | Resp 16 | Ht 72.0 in | Wt 202.0 lb

## 2018-06-25 DIAGNOSIS — R062 Wheezing: Secondary | ICD-10-CM | POA: Diagnosis not present

## 2018-06-25 MED ORDER — PANTOPRAZOLE SODIUM 40 MG PO TBEC: 40.0000 mg | DELAYED_RELEASE_TABLET | Freq: Every day | ORAL | 3 refills | Status: DC

## 2018-06-25 NOTE — Progress Notes (Signed)
Subjective:    Patient ID: Samuel Cunningham, male    DOB: [birth year], 28 y.o.   MRN: [id number]  HPI  05/28/18 Patient is being seen today by telephone.  He consents to be seen by telephone.  He is currently at home.  I am currently at my office.  Phone call began at 204.  Phone call concluded at 224.  Patient was diagnosed with "the flu" on New Year's.  He was given Tamiflu and gradually got better over a couple of weeks.  However he continued to have a cough thereafter.  He was seen at an urgent care and was diagnosed with walking pneumonia.  Per the patient he had a chest x-ray which showed a lower lobe infiltrate at the urgent care.  He was given a Z-Pak for the walking pneumonia the first time.  Gradually the cough improved however he continued to experience wheezing that did not improve.  He states that the wheezing would occur gradually at random times throughout the day usually worse with exercise.  He then went online and had an online video chat and was diagnosed with some type of walking pneumonia or bronchitis and was given Levaquin for 5 days as well as Symbicort 80/4.5, 2 puffs inhaled twice daily.  Patient states that the inhaler seems to help the wheezing at least for a little while but he continues to experience the wheezing intermittently throughout the day.  He also continues to have a cough intermittently throughout the day.  He also reports rhinorrhea and hoarse voice.  He denies any heartburn or indigestion.  He denies any hemoptysis.  He denies any fevers or chills or night sweats or weight loss.  He denies any travel.  His job involves him working outside constantly throughout the day and being exposed to pollen and seasonal allergies.  Patient states that he does not feel sick.  However he is concerned and wants to be proactive given the current environment that there is nothing wrong causing him to continue to wheeze and cough intermittently throughout the day.  At that time, my plan  was: Patient has had a chronic cough ever since he was diagnosed with the flu in January.  Whether it was the flu or coronavirus in January, I believe the initial infection has long since passed.  He has been treated on 2 separate occasions for walking pneumonia and bronchitis with a Z-Pak which should have covered whooping cough as well as Levaquin.  He is even now on a long-acting bronchodilator in the form of Symbicort.  I believe that the infection likely triggered a bronchitis with inflammation in the airways.  However I believe the infection has now passed but rather an irritant is continuing to irritate the airways causing bronchospasm and wheezing and cough.  This irritant could be asthma that is poorly controlled, allergies, or some type of chemical irritants such as acid reflux.  Based on his rhinorrhea and hoarseness, I think the majority of his symptoms are due to allergies.  Therefore I recommended that he continue the Symbicort but I will add prednisone 60 mg a day for 5 days.  I will also start him on Xyzal 5 mg a day and then reassess the patient in 1 week.  If the cough is improving, it would point towards allergies exacerbating asthma as the cause of his chronic cough and wheezing.  If the cough is not improving at that time, I would recommend further imaging of the lungs and  adding a proton pump inhibitor for possible laryngo-esophageal reflux.  Recheck sooner if worse  06/25/18 Patient continues to experience wheezing.  He saw some improvement when he use the prednisone in April.  However his symptoms did not improve dramatically.  He continues to do the Symbicort twice daily with only minimal improvement.  He denies any real coughing.  He does have occasional cough productive of white mucus.  He also has some sneezing.  However his biggest concern is the ejective symptom of wheezing.  He hears himself wheezing.  Even today there is some faint wheezing on inspiration her mainly in the left  upper lobe.  His air exchange is excellent.  He has normal breath sounds bilaterally.  He denies any significant shortness of breath.  He denies any rhinorrhea or itchy watery eyes.  He denies any acid reflux or postnasal drip.  He does have a pet however he has had the laboratory retriever for 5 years.  Has recently moved into a new home however he is seen no visible mold.  He is inspected the crawl space and this appears normal.  There is not a foul or musty odor.  He is change the filters in the air conditioner.  He denies any obvious allergic exposure.  I want to emphasize that the patient does not feel short of breath.  In fact today he is about to go to the park and run 5 miles.  He is not coughing.  He hears inspiratory and expiratory wheezes and this has him concerned.  He states that occasionally he will feel short of breath when he is laying down at night on his side but otherwise he feels normal.  We performed pulmonary function test to obtain a baseline.  His FEV1 is 4.37 L.  His FVC is 5.91 L.  His FEV1 to FVC ratio was 74%.  The patient was then given 2.5 mg of albuterol nebulized and his pulmonary function tests were repeated afterward to see if there was a reversible bronchospasm potentially causing the wheezing that he is hearing.  After receiving the nebulizer treatment, his pulmonary function test are as follows.  Lung volumes did significantly increase but I think this was effort dependent.  The V1 increased to 4.77 L.  FVC increased to 6.03 L.  FEV1 to FVC ratio was 79%.  However there was not a substantial improvement post nebulizer. Past Medical History:  Diagnosis Date   Allergy    Anxiety    Past Surgical History:  Procedure Laterality Date   REPAIR ANKLE LIGAMENT     Current Outpatient Medications on File Prior to Visit  Medication Sig Dispense Refill   budesonide-formoterol (SYMBICORT) 80-4.5 MCG/ACT inhaler Inhale 2 puffs into the lungs 2 (two) times daily for 14 days.  Brush teeth and rinse mouth after use 1 Inhaler 0   escitalopram (LEXAPRO) 10 MG tablet Take 1 tablet (10 mg total) by mouth daily. 90 tablet 3   levocetirizine (XYZAL) 5 MG tablet Take 1 tablet (5 mg total) by mouth every evening. 30 tablet 0   predniSONE (DELTASONE) 20 MG tablet Take 1 tablet (20 mg total) by mouth daily with breakfast. 15 tablet 0   PROAIR HFA 108 (90 Base) MCG/ACT inhaler INHALE 1 TO 2 PUFFS EVERY 4 TO 6 HOURS AS NEEDED FOR COUGH OR SHORTNESS OF BREATH     No current facility-administered medications on file prior to visit.    Allergies  Allergen Reactions   Codeine Itching and Nausea  Only   Social History   Socioeconomic History   Marital status: Single    Spouse name: Not on file   Number of children: Not on file   Years of education: Not on file   Highest education level: Not on file  Occupational History   Not on file  Social Needs   Financial resource strain: Not on file   Food insecurity:    Worry: Not on file    Inability: Not on file   Transportation needs:    Medical: Not on file    Non-medical: Not on file  Tobacco Use   Smoking status: Passive Smoke Exposure - Never Smoker   Smokeless tobacco: Current User    Types: Chew  Substance and Sexual Activity   Alcohol use: Yes   Drug use: No   Sexual activity: Not on file  Lifestyle   Physical activity:    Days per week: Not on file    Minutes per session: Not on file   Stress: Not on file  Relationships   Social connections:    Talks on phone: Not on file    Gets together: Not on file    Attends religious service: Not on file    Active member of club or organization: Not on file    Attends meetings of clubs or organizations: Not on file    Relationship status: Not on file   Intimate partner violence:    Fear of current or ex partner: Not on file    Emotionally abused: Not on file    Physically abused: Not on file    Forced sexual activity: Not on file  Other  Topics Concern   Not on file  Social History Narrative   Not on file      Review of Systems  All other systems reviewed and are negative.      Objective:   Physical Exam Vitals signs reviewed.  Constitutional:      General: He is not in acute distress.    Appearance: Normal appearance. He is not ill-appearing, toxic-appearing or diaphoretic.  HENT:     Right Ear: Tympanic membrane and ear canal normal.     Left Ear: Tympanic membrane and ear canal normal.     Nose: Nose normal. No congestion or rhinorrhea.     Mouth/Throat:     Pharynx: No oropharyngeal exudate or posterior oropharyngeal erythema.  Eyes:     Conjunctiva/sclera: Conjunctivae normal.  Cardiovascular:     Pulses: Normal pulses.     Heart sounds: Normal heart sounds. No murmur.  Pulmonary:     Effort: Pulmonary effort is normal.     Breath sounds: Normal air entry. No stridor, decreased air movement or transmitted upper airway sounds. Examination of the left-upper field reveals wheezing. Wheezing present. No decreased breath sounds.  Neurological:     Mental Status: He is alert.     Very faint inspiratory wheezing heard in the left upper lobe.  There was no expiratory wheezing whatsoever.     Assessment & Plan:  Wheezing  This is been a chronic situation and despite using Symbicort, prednisone, allergy medication, the patient continues to hear occasional expiratory and inspiratory wheezing.  He denies any chronic cough.  He denies any shortness of breath or dyspnea.  I believe this is most likely an irritant causing swelling in his airways perhaps from inhaling mold or possibly acid.  I suspect laryngo-esophageal reflux.  I will try the patient on Protonix 40 mg  daily.  If wheezing persist I would recommend pulmonology consultation

## 2018-06-26 ENCOUNTER — Encounter: Payer: Self-pay | Admitting: Family Medicine

## 2018-06-28 ENCOUNTER — Encounter: Payer: Self-pay | Admitting: Internal Medicine

## 2018-06-28 ENCOUNTER — Other Ambulatory Visit: Payer: Self-pay

## 2018-06-28 ENCOUNTER — Ambulatory Visit: Payer: BLUE CROSS/BLUE SHIELD | Admitting: Internal Medicine

## 2018-06-28 VITALS — BP 108/70 | HR 86 | Ht 72.0 in | Wt 201.0 lb

## 2018-06-28 DIAGNOSIS — J4520 Mild intermittent asthma, uncomplicated: Secondary | ICD-10-CM | POA: Diagnosis not present

## 2018-06-28 MED ORDER — BUDESONIDE-FORMOTEROL FUMARATE 80-4.5 MCG/ACT IN AERO: 2.0000 | INHALATION_SPRAY | Freq: Two times a day (BID) | RESPIRATORY_TRACT | 0 refills | Status: DC

## 2018-06-28 NOTE — Patient Instructions (Addendum)
Symbicort 2 puffs at bedtime for a week then stop  Start protonix 40mg  take one pill about 30 min before your last meal of day x one month then stop - if wheezing flares then let Dr Tanya Nones know and he can take over or refer you to GI  GERD (REFLUX)  is an extremely common cause of respiratory symptoms just like yours , many times with no obvious heartburn at all.    It can be treated with medication, but also with lifestyle changes including elevation of the head of your bed (ideally with 6 -8inch blocks under the headboard of your bed),  Smoking cessation, avoidance of late meals, excessive alcohol, and avoid fatty foods, chocolate, peppermint, colas, red wine, and acidic juices such as orange juice.  NO MINT OR MENTHOL PRODUCTS SO NO COUGH DROPS  USE SUGARLESS CANDY INSTEAD (Jolley ranchers or Stover's or Life Savers) or even ice chips will also do - the key is to swallow to prevent all throat clearing. NO OIL BASED VITAMINS - use powdered substitutes.  Avoid fish oil when coughing.  Please remember to go to the lab department   for your tests - we will call you with the results when they are available.       If all else fails then return before you  Take your inhaler

## 2018-06-28 NOTE — Progress Notes (Addendum)
Samuel Cunningham, male    DOB: 08-23-1990,    MRN: [id number]   Brief patient profile:  27 yowm quit smoking 02/2016 referred to pulmonary clinic 06/28/2018 by Dr   Tanya Nones for variable cough/ wheeze/ sob dating back to episode of "flu" [place]  [place]> Fast med dx "prob FLU" rx tamiflu with "wheezing" but no sob / some cough but did not affect ex or sleep and wheezing   Persists daily esp x first   15 min q am but even when takes  symbicort at hs wakes up at usual hour and "wheezes" x 15 min so Dr Tanya Nones rec he use ppi but pt  did not start yet and in meantime referred to pulmonary clinic 06/28/2018 by Dr  Tanya Nones   History of Present Illness  06/28/2018  Pulmonary/ 1st office eval/Samuel Cunningham  Chief Complaint  Patient presents with  . Pulmonary Consult    Referred by [facility]. Pt c/o wheezing x 12 wks. He notices this most in the am and uses proair daily.   Dyspnea:  Able to jog 4 miles at a time  Cough: none now Sleep: flat on back one pillow  SABA use: last used 3pm not clear why, easily confused between proar and symbicort and very difficult to sort out components of hx related to cause and effect of the different meds he's tried  Does have some overt hb but doesn't usually make him wheeze.  No obvious day to day or daytime variability or assoc excess/ purulent sputum or mucus plugs or hemoptysis or cp or chest tightness, or overt sinus  symptoms.   Sleeping flat  without nocturnal   exacerbation  of respiratory  c/o's or need for noct saba. Also denies any obvious fluctuation of symptoms with weather or environmental changes or other aggravating or alleviating factors except as outlined above   No unusual exposure hx or h/o childhood pna/ asthma or knowledge of premature birth.  Current Allergies, Complete Past Medical History, Past Surgical History, Family History, and Social History were reviewed in [employer] record.  ROS  The following are not active  complaints unless bolded Hoarseness, sore throat, dysphagia, dental problems, itching, sneezing,  nasal congestion or discharge of excess mucus or purulent secretions, ear ache,   fever, chills, sweats, unintended wt loss or wt gain, classically pleuritic or exertional cp,  orthopnea pnd or arm/hand swelling  or leg swelling, presyncope, palpitations, abdominal pain, anorexia, nausea, vomiting, diarrhea  or change in bowel habits or change in bladder habits, change in stools or change in urine, dysuria, hematuria,  rash, arthralgias, visual complaints, headache, numbness, weakness or ataxia or problems with walking or coordination,  change in mood or  memory.           Past Medical History:  Diagnosis Date  . Allergy   . Anxiety     Outpatient Medications Prior to Visit  Medication Sig Dispense Refill  . budesonide-formoterol (SYMBICORT) 80-4.5 MCG/ACT inhaler Inhale 2 puffs into the lungs 2 (two) times daily for 14 days. Brush teeth and rinse mouth after use 1 Inhaler 0  . escitalopram (LEXAPRO) 10 MG tablet Take 1 tablet (10 mg total) by mouth daily. 90 tablet 3  . PROAIR HFA 108 (90 Base) MCG/ACT inhaler INHALE 1 TO 2 PUFFS EVERY 4 TO 6 HOURS AS NEEDED FOR COUGH OR SHORTNESS OF BREATH    . pantoprazole (PROTONIX) 40 MG tablet (has not started yet) Take 1 tablet (40  mg total) by mouth daily. (Patient not taking: Reported on 06/28/2018) 30 tablet 3  . levocetirizine (XYZAL) 5 MG tablet Take 1 tablet (5 mg total) by mouth every evening. (Patient not taking: Reported on 06/25/2018) 30 tablet 0      Objective:     BP 108/70 (BP Location: Left Arm, Cuff Size: Normal)   Pulse 86   Ht 6' (1.829 m)   Wt 201 lb (91.2 kg)   SpO2 97%   BMI 27.26 kg/m   SpO2: 97 % RA w/in 1.5 h of saba hfa rx   HEENT: nl dentition, turbinates bilaterally, and oropharynx. Nl external ear canals without cough reflex   NECK :  without JVD/Nodes/TM/ nl carotid upstrokes bilaterally   LUNGS: no acc muscle use,   Nl contour chest which is clear to A and P bilaterally without cough on insp or exp maneuvers   CV:  RRR  no s3 or murmur or increase in P2, and no edema   ABD:  soft and nontender with nl inspiratory excursion in the supine position. No bruits or organomegaly appreciated, bowel sounds nl  MS:  Nl gait/ ext warm without deformities, calf tenderness, cyanosis or clubbing No obvious joint restrictions   SKIN: warm and dry without lesions    NEURO:  alert, approp, nl sensorium with  no motor or cerebellar deficits apparent.    fastmed cxr clear feb 2020    Labs ordered 06/28/2018  Allergy profile      Assessment   Intermittent asthma without complication Quit smoking 02/2016  - Allergy profile 06/28/2018 >  Eos 0. /  IgE   - 06/28/2018  After extensive coaching inhaler device,  effectiveness =    90% from a baseline of 50%   DDX of  difficult airways management almost all start with A and  include Adherence, Ace Inhibitors, Acid Reflux, Active Sinus Disease, Alpha 1 Antitripsin deficiency, Anxiety masquerading as Airways dz,  ABPA,  Allergy(esp in young), Aspiration (esp in elderly), Adverse effects of meds,  Active smoking or vaping, A bunch of PE's (a small clot burden can't cause this syndrome unless there is already severe underlying pulm or vascular dz with poor reserve) plus two Bs  = Bronchiectasis and Beta blocker use..and one C= CHF   Adherence is always the initial "prime suspect" and is a multilayered concern that requires a "trust but verify" approach in every patient - starting with knowing how to use medications, especially inhalers, correctly, keeping up with refills and understanding the fundamental difference between maintenance and prns vs those medications only taken for a very short course and then stopped and not refilled.  - see hfa teaching  - if not improving return with all meds in hand using a trust but verify approach to confirm accurate Medication   Reconciliation The principal here is that until we are certain that the  patients are doing what we've asked, it makes no sense to ask them to do more.   ? Acid (or non-acid) GERD > always difficult to exclude as up to 75% of pts in some series report no assoc GI/ Heartburn symptoms> rec max (24h)  acid suppression and diet restrictions/ reviewed and instructions given in writing.   - Of the three most common causes of  Sub-acute / recurrent or chronic cough, only one (GERD)  can actually contribute to/ trigger  the other two (asthma and post nasal drip syndrome)  and perpetuate the cylce of cough.  While not  intuitively obvious, many patients with chronic low grade reflux do not cough or wheeze until there is a primary insult that disturbs the protective epithelial barrier and exposes sensitive nerve endings.   This is typically viral but can due to PNDS and  either may apply here.     >>> The point is that once this occurs, it is difficult to eliminate the cycle  using anything but a maximally effective acid suppression regimen at least in the short run, accompanied by an appropriate diet to address non acid GERD and control / eliminate any asthmatic component with low dose symbicort 80 2bid x at least a week and then stop - at this point if better leave off and if not better return to clinic  ? Allergy > doubt but hard to exclude > send profile  ? Active smoking/ vaping > reinforced maint abstinence  ? Anxiety > usually at the bottom of this list of usual suspects but should be much higher on this pt's based on H and P and note already on psychotropics and may interfere with adherence and also interpretation of response or lack thereof to symptom management which can be quite subjective.   ? Active sinus dz > consider CT sinus if not improving    If dx still in doubt consider methacholine challenge test as last step    Total time devoted to counseling  > 50 % of initial 60 min office visit:   review case with pt/  device teaching which extended face to face time for this visit  discussion of options/alternatives/ personally creating written customized instructions  in presence of pt  then going over those specific  Instructions directly with the pt including how to use all of the meds but in particular covering each new medication in detail and the difference between the maintenance= "automatic" meds and the prns using an action plan format for the latter (If this problem/symptom => do that organization reading Left to right).  Please see AVS from this visit for a full list of these instructions which I personally wrote for this pt and  are unique to this visit.      Sandrea HughsMichael Eagle Pitta, MD 06/28/2018

## 2018-06-28 NOTE — Assessment & Plan Note (Signed)
Quit smoking 02/2016  - Allergy profile 06/28/2018 >  Eos 0. /  IgE   - 06/28/2018  After extensive coaching inhaler device,  effectiveness =    90% from a baseline of 50%   DDX of  difficult airways management almost all start with A and  include Adherence, Ace Inhibitors, Acid Reflux, Active Sinus Disease, Alpha 1 Antitripsin deficiency, Anxiety masquerading as Airways dz,  ABPA,  Allergy(esp in young), Aspiration (esp in elderly), Adverse effects of meds,  Active smoking or vaping, A bunch of PE's (a small clot burden can't cause this syndrome unless there is already severe underlying pulm or vascular dz with poor reserve) plus two Bs  = Bronchiectasis and Beta blocker use..and one C= CHF   Adherence is always the initial "prime suspect" and is a multilayered concern that requires a "trust but verify" approach in every patient - starting with knowing how to use medications, especially inhalers, correctly, keeping up with refills and understanding the fundamental difference between maintenance and prns vs those medications only taken for a very short course and then stopped and not refilled.  - see hfa teaching  - if not improving return with all meds in hand using a trust but verify approach to confirm accurate Medication  Reconciliation The principal here is that until we are certain that the  patients are doing what we've asked, it makes no sense to ask them to do more.   ? Acid (or non-acid) GERD > always difficult to exclude as up to 75% of pts in some series report no assoc GI/ Heartburn symptoms> rec max (24h)  acid suppression and diet restrictions/ reviewed and instructions given in writing.   - Of the three most common causes of  Sub-acute / recurrent or chronic cough, only one (GERD)  can actually contribute to/ trigger  the other two (asthma and post nasal drip syndrome)  and perpetuate the cylce of cough.  While not intuitively obvious, many patients with chronic low grade reflux do not  cough or wheeze until there is a primary insult that disturbs the protective epithelial barrier and exposes sensitive nerve endings.   This is typically viral but can due to PNDS and  either may apply here.     >>> The point is that once this occurs, it is difficult to eliminate the cycle  using anything but a maximally effective acid suppression regimen at least in the short run, accompanied by an appropriate diet to address non acid GERD and control / eliminate any asthmatic component with low dose symbicort 80 2bid x at least a week and then stop - at this point if better leave off and if not better return to clinic  ? Allergy > doubt but hard to exclude > send profile  ? Active smoking/ vaping > reinforced.  ? Anxiety > usually at the bottom of this list of usual suspects but should be much higher on this pt's based on H and P and note already on psychotropics and may interfere with adherence and also interpretation of response or lack thereof to symptom management which can be quite subjective.   ? Active sinus dz > consider CT sinus if not improving    If dx still in doubt consider methacholine challenge test as last step    Total time devoted to counseling  > 50 % of initial 60 min office visit:  review case with pt/  device teaching which extended face to face time for this visit  discussion of options/alternatives/ personally creating written customized instructions  in presence of pt  then going over those specific  Instructions directly with the pt including how to use all of the meds but in particular covering each new medication in detail and the difference between the maintenance= "automatic" meds and the prns using an action plan format for the latter (If this problem/symptom => do that organization reading Left to right).  Please see AVS from this visit for a full list of these instructions which I personally wrote for this pt and  are unique to this visit.

## 2018-06-29 LAB — RESPIRATORY ALLERGY PROFILE REGION II ~~LOC~~

## 2018-06-29 LAB — CBC WITH DIFFERENTIAL/PLATELET
Basophils Absolute: 0.1 10*3/uL (ref 0.0–0.1)
Basophils Relative: 1.1 % (ref 0.0–3.0)
Eosinophils Absolute: 0.1 10*3/uL (ref 0.0–0.7)
Eosinophils Relative: 1.9 % (ref 0.0–5.0)
HCT: 41.4 % (ref 39.0–52.0)
Hemoglobin: 14.4 g/dL (ref 13.0–17.0)
Lymphocytes Relative: 28.6 % (ref 12.0–46.0)
Lymphs Abs: 1.6 10*3/uL (ref 0.7–4.0)
MCHC: 34.8 g/dL (ref 30.0–36.0)
MCV: 92.1 fl (ref 78.0–100.0)
Monocytes Absolute: 0.5 10*3/uL (ref 0.1–1.0)
Monocytes Relative: 9 % (ref 3.0–12.0)
Neutro Abs: 3.4 10*3/uL (ref 1.4–7.7)
Neutrophils Relative %: 59.4 % (ref 43.0–77.0)
Platelets: 291 10*3/uL (ref 150.0–400.0)
RBC: 4.5 Mil/uL (ref 4.22–5.81)
RDW: 13.7 % (ref 11.5–15.5)
WBC: 5.7 10*3/uL (ref 4.0–10.5)

## 2018-06-29 LAB — INTERPRETATION:

## 2018-07-02 ENCOUNTER — Telehealth: Payer: Self-pay | Admitting: Internal Medicine

## 2018-07-02 NOTE — Progress Notes (Signed)
Spoke with pt and notified of results per Dr. Wert. Pt verbalized understanding and denied any questions. 

## 2018-07-02 NOTE — Telephone Encounter (Signed)
Increase symbicort to Take 2 puffs first thing in am and then another 2 puffs about 12 hours later.     zpak  Prednisone 10 mg take  4 each am x 2 days,   2 each am x 2 days,  1 each am x 2 days and stop   For cough > mucinex dm 1200 mg every 12 hours as needed  For wheeze > albuterol up to 2 puffs every 4 hours but try not to take before ov with all meds in hand 2 weeks with NP  If get worse on this rx go to ER

## 2018-07-02 NOTE — Telephone Encounter (Signed)
I called the pt to give him his lab results He reports woke up this morning "wheezing like crazy" and also has sore throat and prod cough with yellow sputum  He states that the sore throat and cough with yellow just started today  He denies any chills, fever, HA, muscle aches, or other symptoms  He is following AVS instructions as stated below:  2. Nyoka Cowden, MD (Physician) at 06/28/2018 4:38 PM - Signed    Symbicort 2 puffs at bedtime for a week then stop  Start protonix 40mg  take one pill about 30 min before your last meal of day x one month then stop - if wheezing flares then let Dr Tanya Nones know and he can take over or refer you to GI  GERD (REFLUX)  is an extremely common cause of respiratory symptoms just like yours , many times with no obvious heartburn at all.    It can be treated with medication, but also with lifestyle changes including elevation of the head of your bed (ideally with 6 -8inch blocks under the headboard of your bed),  Smoking cessation, avoidance of late meals, excessive alcohol, and avoid fatty foods, chocolate, peppermint, colas, red wine, and acidic juices such as orange juice.  NO MINT OR MENTHOL PRODUCTS SO NO COUGH DROPS  USE SUGARLESS CANDY INSTEAD (Jolley ranchers or Stover's or Life Savers) or even ice chips will also do - the key is to swallow to prevent all throat clearing. NO OIL BASED VITAMINS - use powdered substitutes.  Avoid fish oil when coughing.  Please remember to go to the lab department   for your tests - we will call you with the results when they are available.       If all else fails then return before you  Take your inhaler       Please advise and send msg back to triage thanks

## 2018-07-02 NOTE — Telephone Encounter (Signed)
Contacted patient by phone re:  Dr. Thurston Hole instructions below.  Patient states he will increase symbicort, begin Mucinex DM (has used in the past) and use albuterol inhaler as needed for wheezing.  He declines prednisone and zpak stating "I don't think it is that bad and prefer not to take those at this time." Patient advised these prescriptions may prevent further worsening of symptoms but prefers to wait on taking these.  Does not want prescriptions sent to pharmacy.    Patient states he will call us if symptoms worsen and he wishes to have pred and zpak sent in.  Agrees to schedule OV with NP.  Scheduled with [place] for 07/20/18 at 0900 - preferred a Friday and will bring meds to visit.  Covid screening questions negative today on call.  Patient advised to call us if he decides he wants meds called in or if symptoms worsen. Patient acknowledged understanding.  Nothing further needed.

## 2018-07-20 ENCOUNTER — Ambulatory Visit: Payer: BLUE CROSS/BLUE SHIELD | Admitting: Nurse Practitioner

## 2018-07-20 ENCOUNTER — Other Ambulatory Visit: Payer: Self-pay

## 2018-07-20 ENCOUNTER — Encounter: Payer: Self-pay | Admitting: Nurse Practitioner

## 2018-07-20 VITALS — BP 124/80 | HR 84 | Temp 98.3°F | Ht 72.0 in | Wt 195.6 lb

## 2018-07-20 DIAGNOSIS — R6889 Other general symptoms and signs: Secondary | ICD-10-CM

## 2018-07-20 DIAGNOSIS — F17200 Nicotine dependence, unspecified, uncomplicated: Secondary | ICD-10-CM

## 2018-07-20 DIAGNOSIS — J4520 Mild intermittent asthma, uncomplicated: Secondary | ICD-10-CM | POA: Diagnosis not present

## 2018-07-20 DIAGNOSIS — Z20822 Contact with and (suspected) exposure to covid-19: Secondary | ICD-10-CM

## 2018-07-20 NOTE — Patient Instructions (Signed)
Will check Covid antibody today  Quit smoking  Symbicort 2 puffs twice daily for 2-4 weeks then stop  Continue protonix 40mg  take one pill about 30 min before your last meal of day x one month then stop - if wheezing flares then let Dr Tanya Nones know and he can take over or refer you to GI  GERD (REFLUX)  is an extremely common cause of respiratory symptoms just like yours , many times with no obvious heartburn at all.    It can be treated with medication, but also with lifestyle changes including elevation of the head of your bed (ideally with 6 -8inch blocks under the headboard of your bed),  Smoking cessation, avoidance of late meals, excessive alcohol, and avoid fatty foods, chocolate, peppermint, colas, red wine, and acidic juices such as orange juice.  NO MINT OR MENTHOL PRODUCTS SO NO COUGH DROPS  USE SUGARLESS CANDY INSTEAD (Jolley ranchers or Stover's or Life Savers) or even ice chips will also do - the key is to swallow to prevent all throat clearing. NO OIL BASED VITAMINS - use powdered substitutes.  Avoid fish oil when coughing.  Follow up: Follow up with Dr. Sherene Sires in 4 weeks or sooner if needed

## 2018-07-20 NOTE — Assessment & Plan Note (Signed)
Patient was last seen by Dr. Sherene Sires on 06/28/2018.  He was started on Symbicort and albuterol as needed.  He was also advised to start Protonix daily.  Patient states that he is improved since his last visit.  He does not have as much wheezing.  He has not been compliant with medications.  He only uses Symbicort occasionally and only uses albuterol around 1 time per week.  He has not been taking Protonix.  Patient admits today that he does smoke cigarettes on the weekends.  He also admits that he does smoke CBD without THC.  Patient did not use inhalers this morning.  He did his morning exercise routine before coming to the office.  This routine includes running 4 miles every day.  He is not wheezing in office or short of breath.  He states that he has not noticed any wheezing with running daily.  He is requesting COVID antibody test due to flulike illness this past December.   Patient Instructions  Will check Covid antibody today  Quit smoking  Symbicort 2 puffs twice daily for 2-4 weeks then stop  Continue protonix 40mg  take one pill about 30 min before your last meal of day x one month then stop - if wheezing flares then let Dr Tanya Nones know and he can take over or refer you to GI  GERD (REFLUX)  is an extremely common cause of respiratory symptoms just like yours , many times with no obvious heartburn at all.    It can be treated with medication, but also with lifestyle changes including elevation of the head of your bed (ideally with 6 -8inch blocks under the headboard of your bed),  Smoking cessation, avoidance of late meals, excessive alcohol, and avoid fatty foods, chocolate, peppermint, colas, red wine, and acidic juices such as orange juice.  NO MINT OR MENTHOL PRODUCTS SO NO COUGH DROPS  USE SUGARLESS CANDY INSTEAD (Jolley ranchers or Stover's or Life Savers) or even ice chips will also do - the key is to swallow to prevent all throat clearing. NO OIL BASED VITAMINS - use powdered  substitutes.  Avoid fish oil when coughing.  Follow up: Follow up with Dr. Sherene Sires in 4 weeks or sooner if needed

## 2018-07-20 NOTE — Progress Notes (Signed)
  ID: Samuel Cunningham, male    DOB: Mar 17, 1990, 28 y.o.   MRN: [id number]  Chief Complaint  Patient presents with  . Wheezing    Referring provider: Donita Brooks, MD  HPI  28 year old male smoker with mild intermittent asthma who is followed by Dr. Sherene Sires.  Tests: Quit smoking 02/2016  - Allergy profile 06/28/2018 >  Eos 0.1/  IgE  11 RAST neg - 06/28/2018  After extensive coaching inhaler device,  effectiveness =  90% from a baseline of 50%   OV 07/20/18 - Follow up Patient presents today for follow-up visit.  He was last seen by Dr. Sherene Sires on 06/28/2018.  He was started on Symbicort and albuterol as needed.  He was also advised to start Protonix daily.  Patient states that he is improved since his last visit.  He does not have as much wheezing.  He has not been compliant with medications.  He only uses Symbicort occasionally and only uses albuterol around 1 time per week.  He has not been taking Protonix.  Patient admits today that he does smoke cigarettes on the weekends.  He also admits that he does smoke CBD without THC.  Patient did not use inhalers this morning.  He did his morning exercise routine before coming to the office.  This routine includes running 4 miles every day.  He is not wheezing in office or short of breath.  He states that he has not noticed any wheezing with running daily.  He is requesting COVID antibody test due to flulike illness this past December. Denies f/c/s, n/v/d, hemoptysis, PND, leg swelling.     Allergies  Allergen Reactions  . Codeine Itching and Nausea Only    Immunization History  Administered Date(s) Administered  . Rabies, IM 03/29/2012, 04/02/2012, 04/06/2012, 04/16/2012  . Rabies, intradermal 03/29/2012  . Tdap 03/29/2012    Past Medical History:  Diagnosis Date  . Allergy   . Anxiety     Tobacco History: Social History   Tobacco Use  Smoking Status Current Some Day Smoker  . Packs/day: 0.50  . Years: 10.00  . Pack  years: 5.00  . Types: Cigarettes  . Last attempt to quit: 02/15/2016  . Years since quitting: 2.4  Smokeless Tobacco Current User  . Types: Chew   Ready to quit: No Counseling given: Yes   Outpatient Encounter Medications as of 07/20/2018  Medication Sig  . budesonide-formoterol (SYMBICORT) 80-4.5 MCG/ACT inhaler Inhale 2 puffs into the lungs 2 (two) times a day.  . escitalopram (LEXAPRO) 10 MG tablet Take 1 tablet (10 mg total) by mouth daily.  . pantoprazole (PROTONIX) 40 MG tablet Take 1 tablet (40 mg total) by mouth daily.  Marland Kitchen PROAIR HFA 108 (90 Base) MCG/ACT inhaler INHALE 1 TO 2 PUFFS EVERY 4 TO 6 HOURS AS NEEDED FOR COUGH OR SHORTNESS OF BREATH  . [DISCONTINUED] budesonide-formoterol (SYMBICORT) 80-4.5 MCG/ACT inhaler Inhale 2 puffs into the lungs 2 (two) times daily for 14 days. Brush teeth and rinse mouth after use   No facility-administered encounter medications on file as of 07/20/2018.      Review of Systems  Review of Systems  Constitutional: Negative.  Negative for chills and fever.  HENT: Negative.   Respiratory: Negative for cough, shortness of breath and wheezing.   Cardiovascular: Negative.  Negative for chest pain, palpitations and leg swelling.  Gastrointestinal: Negative.   Allergic/Immunologic: Negative.   Neurological: Negative.   Psychiatric/Behavioral: Negative.  Physical Exam  BP 124/80 (BP Location: Left Arm, Patient Position: Sitting, Cuff Size: Normal)   Pulse 84   Temp 98.3 F (36.8 C)   Ht 6' (1.829 m)   Wt 195 lb 9.6 oz (88.7 kg)   SpO2 99%   BMI 26.53 kg/m   Wt Readings from Last 5 Encounters:  07/20/18 195 lb 9.6 oz (88.7 kg)  06/28/18 201 lb (91.2 kg)  06/25/18 202 lb (91.6 kg)  11/17/16 200 lb (90.7 kg)  07/15/16 208 lb (94.3 kg)     Physical Exam Vitals signs and nursing note reviewed.  Constitutional:      General: He is not in acute distress.    Appearance: He is well-developed.  Cardiovascular:     Rate and  Rhythm: Normal rate and regular rhythm.  Pulmonary:     Effort: Pulmonary effort is normal. No respiratory distress.     Breath sounds: Normal breath sounds. No wheezing or rhonchi.  Musculoskeletal:        General: No swelling.  Skin:    General: Skin is warm and dry.  Neurological:     Mental Status: He is alert and oriented to person, place, and time.       Assessment & Plan:   Intermittent asthma without complication Patient was last seen by Dr. Sherene Sires on 06/28/2018.  He was started on Symbicort and albuterol as needed.  He was also advised to start Protonix daily.  Patient states that he is improved since his last visit.  He does not have as much wheezing.  He has not been compliant with medications.  He only uses Symbicort occasionally and only uses albuterol around 1 time per week.  He has not been taking Protonix.  Patient admits today that he does smoke cigarettes on the weekends.  He also admits that he does smoke CBD without THC.  Patient did not use inhalers this morning.  He did his morning exercise routine before coming to the office.  This routine includes running 4 miles every day.  He is not wheezing in office or short of breath.  He states that he has not noticed any wheezing with running daily.  He is requesting COVID antibody test due to flulike illness this past December.   Patient Instructions  Will check Covid antibody today  Quit smoking  Symbicort 2 puffs twice daily for 2-4 weeks then stop  Continue protonix 40mg  take one pill about 30 min before your last meal of day x one month then stop - if wheezing flares then let Dr Tanya Nones know and he can take over or refer you to GI  GERD (REFLUX)  is an extremely common cause of respiratory symptoms just like yours , many times with no obvious heartburn at all.    It can be treated with medication, but also with lifestyle changes including elevation of the head of your bed (ideally with 6 -8inch blocks under the  headboard of your bed),  Smoking cessation, avoidance of late meals, excessive alcohol, and avoid fatty foods, chocolate, peppermint, colas, red wine, and acidic juices such as orange juice.  NO MINT OR MENTHOL PRODUCTS SO NO COUGH DROPS  USE SUGARLESS CANDY INSTEAD (Jolley ranchers or Stover's or Life Savers) or even ice chips will also do - the key is to swallow to prevent all throat clearing. NO OIL BASED VITAMINS - use powdered substitutes.  Avoid fish oil when coughing.  Follow up: Follow up with Dr. Sherene Sires in 4 weeks  or sooner if needed    Current smoker Patient admits today that he is still smoking.  He states that he only smokes on the weekends.  He does also smoke CBD without THC.  Smoking cessation instruction/counseling given:  counseled patient on the dangers of tobacco use, advised patient to stop smoking, and reviewed strategies to maximize success      Ivonne Andrewonya S Caelynn Marshman, NP 07/20/2018

## 2018-07-20 NOTE — Assessment & Plan Note (Signed)
Patient admits today that he is still smoking.  He states that he only smokes on the weekends.  He does also smoke CBD without THC.  Smoking cessation instruction/counseling given:  counseled patient on the dangers of tobacco use, advised patient to stop smoking, and reviewed strategies to maximize success

## 2018-07-21 LAB — SAR COV2 SEROLOGY (COVID19)AB(IGG),IA: SARS CoV2 AB IGG: NEGATIVE

## 2018-07-23 NOTE — Progress Notes (Signed)
Spoke with the pt and notified of results

## 2018-07-25 NOTE — Progress Notes (Signed)
Chart and office note reviewed in detail  > agree with a/p as outlined    

## 2018-08-21 ENCOUNTER — Emergency Department (HOSPITAL_COMMUNITY)
Admission: EM | Admit: 2018-08-21 | Discharge: 2018-08-22 | Disposition: A | Discharge status: Home / Self Care | Payer: BC Managed Care – PPO | Attending: Emergency Medicine | Admitting: Emergency Medicine

## 2018-08-21 ENCOUNTER — Encounter (HOSPITAL_COMMUNITY): Payer: Self-pay | Admitting: *Deleted

## 2018-08-21 ENCOUNTER — Other Ambulatory Visit: Payer: Self-pay

## 2018-08-21 DIAGNOSIS — Y929 Unspecified place or not applicable: Secondary | ICD-10-CM | POA: Diagnosis not present

## 2018-08-21 DIAGNOSIS — Z79899 Other long term (current) drug therapy: Secondary | ICD-10-CM | POA: Diagnosis not present

## 2018-08-21 DIAGNOSIS — S61256A Open bite of right little finger without damage to nail, initial encounter: Secondary | ICD-10-CM | POA: Insufficient documentation

## 2018-08-21 DIAGNOSIS — J45909 Unspecified asthma, uncomplicated: Secondary | ICD-10-CM | POA: Insufficient documentation

## 2018-08-21 DIAGNOSIS — F17210 Nicotine dependence, cigarettes, uncomplicated: Secondary | ICD-10-CM | POA: Diagnosis not present

## 2018-08-21 DIAGNOSIS — Y999 Unspecified external cause status: Secondary | ICD-10-CM | POA: Diagnosis not present

## 2018-08-21 DIAGNOSIS — T148XXA Other injury of unspecified body region, initial encounter: Secondary | ICD-10-CM

## 2018-08-21 DIAGNOSIS — Z23 Encounter for immunization: Secondary | ICD-10-CM | POA: Insufficient documentation

## 2018-08-21 DIAGNOSIS — W5581XA Bitten by other mammals, initial encounter: Secondary | ICD-10-CM | POA: Diagnosis not present

## 2018-08-21 DIAGNOSIS — F17220 Nicotine dependence, chewing tobacco, uncomplicated: Secondary | ICD-10-CM | POA: Diagnosis not present

## 2018-08-21 DIAGNOSIS — Y9389 Activity, other specified: Secondary | ICD-10-CM | POA: Diagnosis not present

## 2018-08-21 NOTE — ED Triage Notes (Signed)
Pt states he was feeding his dogs and when he reached into  The bag a possum bit his right little finger; pt has small bite mark to hand; pt called [place] [facility] and they advised him to come to hospital

## 2018-08-21 NOTE — ED Notes (Signed)
Called and reported to [facility].

## 2018-08-22 MED ORDER — TETANUS-DIPHTH-ACELL PERTUSSIS 5-2.5-18.5 LF-MCG/0.5 IM SUSP
0.5000 mL | Freq: Once | INTRAMUSCULAR | Status: AC
  Administered 2018-08-22: 0.5 mL via INTRAMUSCULAR
  Filled 2018-08-22: qty 0.5

## 2018-08-22 NOTE — Discharge Instructions (Addendum)
I have discussed your incident with animal control. You are not at risk for rabies.

## 2018-08-22 NOTE — ED Provider Notes (Addendum)
[facility] EMERGENCY DEPARTMENT Provider Note   CSN: [id number] Arrival date & time: 08/21/18  2140    History   Chief Complaint Chief Complaint  Patient presents with  . Animal Bite    HPI Samuel Cunningham is a 28 y.o. male.   The history is provided by the patient.  Animal Bite He has history of asthma and comes in because of an animal bite.  He states that and possibly got into his dog food bag and, when he tried to get it out, it bit him on the right fifth finger.  He then called the animal.  He does not know when his last tetanus immunization was.  Past Medical History:  Diagnosis Date  . Allergy   . Anxiety     Patient Active Problem List   Diagnosis Date Noted  . Current smoker 07/20/2018  . Intermittent asthma without complication 06/28/2018  . Persistent cough for 3 weeks or longer 05/16/2018  . Allergy     Past Surgical History:  Procedure Laterality Date  . REPAIR ANKLE LIGAMENT    . WISDOM TOOTH EXTRACTION          Home Medications    Prior to Admission medications   Medication Sig Start Date End Date Taking? Authorizing Provider  budesonide-formoterol (SYMBICORT) 80-4.5 MCG/ACT inhaler Inhale 2 puffs into the lungs 2 (two) times a day. 06/28/18   Nyoka CowdenWert, Oren B, MD  escitalopram (LEXAPRO) 10 MG tablet Take 1 tablet (10 mg total) by mouth daily. 10/17/17   Donita BrooksPickard, Warren T, MD  pantoprazole (PROTONIX) 40 MG tablet Take 1 tablet (40 mg total) by mouth daily. 06/25/18   Donita BrooksPickard, Warren T, MD  PROAIR HFA 108 [phone](90 Base) MCG/ACT inhaler INHALE 1 TO 2 PUFFS EVERY 4 TO 6 HOURS AS NEEDED FOR COUGH OR SHORTNESS OF BREATH 04/23/18   [provider]    Family History Family History  Problem Relation Age of Onset  . Hypertension Mother   . Mental illness Brother        anxiety  . Heart attack Maternal Grandfather   . Heart attack Paternal Grandmother   . Heart attack Paternal Grandfather     Social History Social History   Tobacco Use  .  Smoking status: Current Some Day Smoker    Packs/day: 0.50    Years: 10.00    Pack years: 5.00    Types: Cigarettes    Last attempt to quit: 02/15/2016    Years since quitting: 2.5  . Smokeless tobacco: Current User    Types: Chew  Substance Use Topics  . Alcohol use: Yes  . Drug use: No     Allergies   Codeine   Review of Systems Review of Systems  All other systems reviewed and are negative.    Physical Exam Updated Vital Signs BP 117/78 (BP Location: Right Arm)   Pulse 60   Temp 97.9 F (36.6 C) (Oral)   Resp 18   Ht 6' (1.829 m)   Wt 88.5 kg   SpO2 100%   BMI 26.45 kg/m   Physical Exam Vitals signs and nursing note reviewed.    28 year old male, resting comfortably and in no acute distress. Vital signs are normal. Oxygen saturation is 100%, which is normal. Head is normocephalic and atraumatic. PERRLA, EOMI. Oropharynx is clear. Neck is nontender and supple without adenopathy or JVD. Back is nontender and there is no CVA tenderness. Lungs are clear without rales, wheezes, or rhonchi. Chest  is nontender. Heart has regular rate and rhythm without murmur. Abdomen is soft, flat, nontender without masses or hepatosplenomegaly and peristalsis is normoactive. Extremities have no cyanosis or edema, full range of motion is present.  Small puncture wound seen in the right fifth PIP joint dorsally. Skin is warm and dry without rash. Neurologic: Mental status is normal, cranial nerves are intact, there are no motor or sensory deficits.  ED Treatments / Results  Labs (all labs ordered are listed, but only abnormal results are displayed) Labs Reviewed - No data to display  EKG None  Radiology No results found.  Procedures Procedures (including critical care time)  Medications Ordered in ED Medications  Tdap (BOOSTRIX) injection 0.5 mL (has no administration in time range)     Initial Impression / Assessment and Plan / ED Course  I have reviewed the  triage vital signs and the nursing notes.  Animal bite from a possum.  Tdap booster is given.  I have discussed this with Sheriff's department and they have been told by their veterinarians that possums do not carry rabies.  Since the bite occurred from the patient cornering the animal and it behave normally, this is a low risk encounter.  No need for rabies vaccine.  Final Clinical Impressions(s) / ED Diagnoses   Final diagnoses:  Animal bite    ED Discharge Orders    None       Delora Fuel, MD 87/56/43 [id number]    Delora Fuel, MD 18/84/16 [phone]

## 2018-08-24 ENCOUNTER — Ambulatory Visit: Payer: BC Managed Care – PPO | Admitting: Internal Medicine

## 2018-08-24 ENCOUNTER — Encounter: Payer: Self-pay | Admitting: Primary Care

## 2018-08-24 ENCOUNTER — Ambulatory Visit: Payer: BC Managed Care – PPO | Admitting: Primary Care

## 2018-08-24 ENCOUNTER — Other Ambulatory Visit: Payer: Self-pay

## 2018-08-24 VITALS — BP 102/68 | HR 52 | Temp 97.9°F | Ht 72.0 in | Wt 199.8 lb

## 2018-08-24 DIAGNOSIS — J4520 Mild intermittent asthma, uncomplicated: Secondary | ICD-10-CM

## 2018-08-24 MED ORDER — BUDESONIDE-FORMOTEROL FUMARATE 80-4.5 MCG/ACT IN AERO: 2.0000 | INHALATION_SPRAY | Freq: Two times a day (BID) | RESPIRATORY_TRACT | 0 refills | Status: DC

## 2018-08-24 NOTE — Progress Notes (Signed)
@Patient  ID: Samuel Cunningham, male    DOB: 04-18-1990, 28 y.o.   MRN: [id number]  No chief complaint on file.   Referring provider: Susy Frizzle, MD  HPI: 28 year old male, current occasional smoker (smoked CBD without THC). PMH significant for intermittent asthma. Patient of Dr. Melvyn Novas, last seen by pulmonary NP 07/20/18. Maintained on Symbicort 80, prn albuterol and protonix daily. Covid antibody test negative. Advised patient quit smoking.   08/24/2018 Patient presents today for 4 week follow-up. He is feeling well, no acute complaints. He stopped using Symbicort and Protonix two weeks ago. Has occasional dry cough that does not particularly bother him. States that his diet is pretty good. Rarely smokes a cigar with friends when out. Denies wheezing or shortness of breath.   Tests: Quit smoking 02/2016  - Allergy profile 06/28/2018 >  Eos 0.1/  IgE  11 RAST neg - 06/28/2018  After extensive coaching inhaler device,  effectiveness =  90% from a baseline of 50%   Allergies  Allergen Reactions  . Codeine Itching and Nausea Only    Immunization History  Administered Date(s) Administered  . Rabies, IM 03/29/2012, 04/02/2012, 04/06/2012, 04/16/2012  . Rabies, intradermal 03/29/2012  . Tdap 03/29/2012, 08/22/2018    Past Medical History:  Diagnosis Date  . Allergy   . Anxiety     Tobacco History: Social History   Tobacco Use  Smoking Status Former Smoker  . Packs/day: 0.50  . Years: 10.00  . Pack years: 5.00  . Types: Cigarettes  . Quit date: 02/15/2016  . Years since quitting: 2.5  Smokeless Tobacco Current User  . Types: Chew   Ready to quit: Not Answered Counseling given: Not Answered   Outpatient Medications Prior to Visit  Medication Sig Dispense Refill  . escitalopram (LEXAPRO) 10 MG tablet Take 1 tablet (10 mg total) by mouth daily. 90 tablet 3  . PROAIR HFA 108 (90 Base) MCG/ACT inhaler INHALE 1 TO 2 PUFFS EVERY 4 TO 6 HOURS AS NEEDED FOR COUGH OR SHORTNESS  OF BREATH    . budesonide-formoterol (SYMBICORT) 80-4.5 MCG/ACT inhaler Inhale 2 puffs into the lungs 2 (two) times a day. 1 Inhaler 0  . pantoprazole (PROTONIX) 40 MG tablet Take 1 tablet (40 mg total) by mouth daily. 30 tablet 3   No facility-administered medications prior to visit.    Review of Systems  Review of Systems  Constitutional: Negative.   HENT: Negative.   Respiratory: Positive for cough. Negative for shortness of breath and wheezing.   Cardiovascular: Negative.    Physical Exam  BP 102/68 (BP Location: Left Arm, Patient Position: Sitting, Cuff Size: Normal)   Pulse (!) 52   Temp 97.9 F (36.6 C) (Oral)   Ht 6' (1.829 m)   Wt 199 lb 12.8 oz (90.6 kg)   SpO2 99%   BMI 27.10 kg/m  Physical Exam Constitutional:      Appearance: He is well-developed.  HENT:     Head: Normocephalic and atraumatic.     Mouth/Throat:     Mouth: Mucous membranes are moist.     Pharynx: Oropharynx is clear.  Eyes:     Pupils: Pupils are equal, round, and reactive to light.  Neck:     Musculoskeletal: Normal range of motion and neck supple.  Cardiovascular:     Rate and Rhythm: Normal rate and regular rhythm.     Heart sounds: Normal heart sounds.  Pulmonary:     Effort: Pulmonary effort is normal.  No respiratory distress.     Breath sounds: Normal breath sounds. No wheezing.     Comments: CTA Musculoskeletal: Normal range of motion.  Skin:    General: Skin is warm and dry.     Findings: No erythema or rash.  Neurological:     General: No focal deficit present.     Mental Status: He is alert and oriented to person, place, and time. Mental status is at baseline.  Psychiatric:        Mood and Affect: Mood normal.        Behavior: Behavior normal.        Thought Content: Thought content normal.        Judgment: Judgment normal.      Lab Results:  CBC    Component Value Date/Time   WBC 5.7 06/28/2018 1644   RBC 4.50 06/28/2018 1644   HGB 14.4 06/28/2018 1644   HCT  41.4 06/28/2018 1644   PLT 291.0 06/28/2018 1644   MCV 92.1 06/28/2018 1644   MCH 31.6 09/30/2015 0926   MCHC 34.8 06/28/2018 1644   RDW 13.7 06/28/2018 1644   LYMPHSABS 1.6 06/28/2018 1644   MONOABS 0.5 06/28/2018 1644   EOSABS 0.1 06/28/2018 1644   BASOSABS 0.1 06/28/2018 1644    BMET    Component Value Date/Time   NA 140 09/30/2015 0926   K 4.6 09/30/2015 0926   CL 104 09/30/2015 0926   CO2 30 09/30/2015 0926   GLUCOSE 87 09/30/2015 0926   BUN 14 09/30/2015 0926   CREATININE 0.96 09/30/2015 0926   CALCIUM 9.0 09/30/2015 0926   GFRNONAA >89 09/30/2015 0926   GFRAA >89 09/30/2015 0926    BNP No results found for: BNP  ProBNP No results found for: PROBNP  Imaging: No results found.   Assessment & Plan:   Intermittent asthma without complication - Stable; no acute symptoms  - Continue Symbicort 80 1-2 puffs twice daily as needed for asthma symptoms  - PRN albuterol q 6 hours  - Continue to encourage complete smoking cessation  - FU in 6 months or sooner if needed   Glenford BayleyElizabeth W Raekwan Spelman, NP 08/24/2018

## 2018-08-24 NOTE — Assessment & Plan Note (Signed)
-   Stable; no acute symptoms  - Continue Symbicort 80 1-2 puffs twice daily as needed for asthma symptoms  - PRN albuterol q 6 hours  - Continue to encourage complete smoking cessation  - FU in 6 months or sooner if needed

## 2018-08-24 NOTE — Patient Instructions (Addendum)
Pleasure meeting you, glad you are doing well. You were seen today for mild intermittent asthma symptoms  Use Symbicort 1-2 puffs twice daily as needed if asthma symptoms flare (sample given)  As needed albuterol 2 puffs every 4-6 hours for shortness of breath or wheezing  Protonix for GERD/acid reflux symptoms or increased cough   Follow-up: 6 months    Asthma, Adult  Asthma is a long-term (chronic) condition in which the airways get tight and narrow. The airways are the breathing passages that lead from the nose and mouth down into the lungs. A person with asthma will have times when symptoms get worse. These are called asthma attacks. They can cause coughing, whistling sounds when you breathe (wheezing), shortness of breath, and chest pain. They can make it hard to breathe. There is no cure for asthma, but medicines and lifestyle changes can help control it. There are many things that can bring on an asthma attack or make asthma symptoms worse (triggers). Common triggers include:  Mold.  Dust.  Cigarette smoke.  Cockroaches.  Things that can cause allergy symptoms (allergens). These include animal skin flakes (dander) and pollen from trees or grass.  Things that pollute the air. These may include household cleaners, wood smoke, smog, or chemical odors.  Cold air, weather changes, and wind.  Crying or laughing hard.  Stress.  Certain medicines or drugs.  Certain foods such as dried fruit, potato chips, and grape juice.  Infections, such as a cold or the flu.  Certain medical conditions or diseases.  Exercise or tiring activities. Asthma may be treated with medicines and by staying away from the things that cause asthma attacks. Types of medicines may include:  Controller medicines. These help prevent asthma symptoms. They are usually taken every day.  Fast-acting reliever or rescue medicines. These quickly relieve asthma symptoms. They are used as needed and provide  short-term relief.  Allergy medicines if your attacks are brought on by allergens.  Medicines to help control the body's defense (immune) system. Follow these instructions at home: Avoiding triggers in your home  Change your heating and air conditioning filter often.  Limit your use of fireplaces and wood stoves.  Get rid of pests (such as roaches and mice) and their droppings.  Throw away plants if you see mold on them.  Clean your floors. Dust regularly. Use cleaning products that do not smell.  Have someone vacuum when you are not home. Use a vacuum cleaner with a HEPA filter if possible.  Replace carpet with wood, tile, or vinyl flooring. Carpet can trap animal skin flakes and dust.  Use allergy-proof pillows, mattress covers, and box spring covers.  Wash bed sheets and blankets every week in hot water. Dry them in a dryer.  Keep your bedroom free of any triggers.  Avoid pets and keep windows closed when things that cause allergy symptoms are in the air.  Use blankets that are made of polyester or cotton.  Clean bathrooms and kitchens with bleach. If possible, have someone repaint the walls in these rooms with mold-resistant paint. Keep out of the rooms that are being cleaned and painted.  Wash your hands often with soap and water. If soap and water are not available, use hand sanitizer.  Do not allow anyone to smoke in your home. General instructions  Take over-the-counter and prescription medicines only as told by your doctor. ? Talk with your doctor if you have questions about how or when to take your medicines. ?  Make note if you need to use your medicines more often than usual.  Do not use any products that contain nicotine or tobacco, such as cigarettes and e-cigarettes. If you need help quitting, ask your doctor.  Stay away from secondhand smoke.  Avoid doing things outdoors when allergen counts are high and when air quality is low.  Wear a ski mask when  doing outdoor activities in the winter. The mask should cover your nose and mouth. Exercise indoors on cold days if you can.  Warm up before you exercise. Take time to cool down after exercise.  Use a peak flow meter as told by your doctor. A peak flow meter is a tool that measures how well the lungs are working.  Keep track of the peak flow meter's readings. Write them down.  Follow your asthma action plan. This is a written plan for taking care of your asthma and treating your attacks.  Make sure you get all the shots (vaccines) that your doctor recommends. Ask your doctor about a flu shot and a pneumonia shot.  Keep all follow-up visits as told by your doctor. This is important. Contact a doctor if:  You have wheezing, shortness of breath, or a cough even while taking medicine to prevent attacks.  The mucus you cough up (sputum) is thicker than usual.  The mucus you cough up changes from clear or white to yellow, green, gray, or bloody.  You have problems from the medicine you are taking, such as: ? A rash. ? Itching. ? Swelling. ? Trouble breathing.  You need reliever medicines more than 2-3 times a week.  Your peak flow reading is still at 50-79% of your personal best after following the action plan for 1 hour.  You have a fever. Get help right away if:  You seem to be worse and are not responding to medicine during an asthma attack.  You are short of breath even at rest.  You get short of breath when doing very little activity.  You have trouble eating, drinking, or talking.  You have chest pain or tightness.  You have a fast heartbeat.  Your lips or fingernails start to turn blue.  You are light-headed or dizzy, or you faint.  Your peak flow is less than 50% of your personal best.  You feel too tired to breathe normally. Summary  Asthma is a long-term (chronic) condition in which the airways get tight and narrow. An asthma attack can make it hard to  breathe.  Asthma cannot be cured, but medicines and lifestyle changes can help control it.  Make sure you understand how to avoid triggers and how and when to use your medicines. This information is not intended to replace advice given to you by your health care provider. Make sure you discuss any questions you have with your health care provider. Document Released: 07/20/2007 Document Revised: 04/05/2018 Document Reviewed: 03/07/2016 Elsevier Patient Education  2020 [employer].

## 2018-08-27 NOTE — Progress Notes (Signed)
Chart and office note reviewed in detail  > agree with a/p as outlined    

## 2018-09-16 ENCOUNTER — Other Ambulatory Visit: Payer: Self-pay | Admitting: Family Medicine

## 2018-09-28 DIAGNOSIS — M9901 Segmental and somatic dysfunction of cervical region: Secondary | ICD-10-CM | POA: Diagnosis not present

## 2018-09-28 DIAGNOSIS — M9902 Segmental and somatic dysfunction of thoracic region: Secondary | ICD-10-CM | POA: Diagnosis not present

## 2018-09-28 DIAGNOSIS — M542 Cervicalgia: Secondary | ICD-10-CM | POA: Diagnosis not present

## 2018-09-28 DIAGNOSIS — M546 Pain in thoracic spine: Secondary | ICD-10-CM | POA: Diagnosis not present

## 2018-11-16 ENCOUNTER — Other Ambulatory Visit: Payer: Self-pay | Admitting: Family Medicine

## 2019-01-15 ENCOUNTER — Encounter: Payer: Self-pay | Admitting: Family Medicine

## 2019-01-15 ENCOUNTER — Other Ambulatory Visit: Payer: Self-pay

## 2019-01-15 ENCOUNTER — Ambulatory Visit (INDEPENDENT_AMBULATORY_CARE_PROVIDER_SITE_OTHER): Payer: BC Managed Care – PPO | Admitting: Family Medicine

## 2019-01-15 VITALS — BP 110/64 | HR 62 | Temp 97.5°F | Resp 12 | Ht 72.0 in | Wt 203.0 lb

## 2019-01-15 DIAGNOSIS — L639 Alopecia areata, unspecified: Secondary | ICD-10-CM | POA: Diagnosis not present

## 2019-01-15 DIAGNOSIS — G8929 Other chronic pain: Secondary | ICD-10-CM | POA: Diagnosis not present

## 2019-01-15 DIAGNOSIS — M25552 Pain in left hip: Secondary | ICD-10-CM

## 2019-01-15 NOTE — Progress Notes (Signed)
Subjective:    Patient ID: Samuel Cunningham, male    DOB: [birth year], 28 y.o.   MRN: [id number]  HPI  Patient presents with 4 to 5 months of left hip pain.  The pain is located in the anterior portion of left hip.  The patient states that it occurs mainly after running a prolonged period of time.  He will develop tightness in the medial aspect of his left quadricep.  It will radiate into the right hip.  It is worse if he sits for a while and tries to stand up he will feel a pulling sensation in the medial groin.  Pain is also worse if he tries to abduct his left hip against resistance such as dragging an object across the floor with his foot.  He reports pain in the anterior medial portion of the right hip when this occurs.  He has put up with it for so long but it is steadily worsening and now it is occurring while running.  On examination today, patient has significant pain with FADIR.  This is concerning for possible labral tear.  He has no pain with Pearlean Brownie maneuver.  He has no pain with abduction against resistance today on exam although this is reported in his history.  He has no tenderness to palpation over the greater trochanteric bursa.  He also has alopecia under his chin.  He has a very sharp well-circumscribed patch approximately 5 cm in diameter directly below his chin with no hair growth whatsoever.  This is been present for several months.  He has been under more stress recently as he recently got married and his wife is expecting their first child.  However it is not improving and he is interested in possible treatment Past Medical History:  Diagnosis Date  . Allergy   . Anxiety    Past Surgical History:  Procedure Laterality Date  . REPAIR ANKLE LIGAMENT    . WISDOM TOOTH EXTRACTION     Current Outpatient Medications on File Prior to Visit  Medication Sig Dispense Refill  . budesonide-formoterol (SYMBICORT) 80-4.5 MCG/ACT inhaler Inhale 2 puffs into the lungs 2 (two) times daily.  1 Inhaler 0  . escitalopram (LEXAPRO) 10 MG tablet TAKE 1 TABLET BY MOUTH EVERY DAY 90 tablet 3  . pantoprazole (PROTONIX) 40 MG tablet TAKE 1 TABLET BY MOUTH EVERY DAY 90 tablet 1  . PROAIR HFA 108 (90 Base) MCG/ACT inhaler INHALE 1 TO 2 PUFFS EVERY 4 TO 6 HOURS AS NEEDED FOR COUGH OR SHORTNESS OF BREATH     No current facility-administered medications on file prior to visit.    Allergies  Allergen Reactions  . Codeine Itching and Nausea Only   Social History   Socioeconomic History  . Marital status: Single    Spouse name: Not on file  . Number of children: Not on file  . Years of education: Not on file  . Highest education level: Not on file  Occupational History  . Not on file  Social Needs  . Financial resource strain: Not on file  . Food insecurity    Worry: Not on file    Inability: Not on file  . Transportation needs    Medical: Not on file    Non-medical: Not on file  Tobacco Use  . Smoking status: Former Smoker    Packs/day: 0.50    Years: 10.00    Pack years: 5.00    Types: Cigarettes    Quit date: 02/15/2016  Years since quitting: 2.9  . Smokeless tobacco: Current User    Types: Chew  Substance and Sexual Activity  . Alcohol use: Yes  . Drug use: No  . Sexual activity: Not on file  Lifestyle  . Physical activity    Days per week: Not on file    Minutes per session: Not on file  . Stress: Not on file  Relationships  . Social Herbalist on phone: Not on file    Gets together: Not on file    Attends religious service: Not on file    Active member of club or organization: Not on file    Attends meetings of clubs or organizations: Not on file    Relationship status: Not on file  . Intimate partner violence    Fear of current or ex partner: Not on file    Emotionally abused: Not on file    Physically abused: Not on file    Forced sexual activity: Not on file  Other Topics Concern  . Not on file  Social History Narrative  . Not on file       Review of Systems  All other systems reviewed and are negative.      Objective:   Physical Exam Vitals signs reviewed.  Constitutional:      General: He is not in acute distress.    Appearance: Normal appearance. He is not ill-appearing, toxic-appearing or diaphoretic.  Neck:   Cardiovascular:     Rate and Rhythm: Normal rate and regular rhythm.     Pulses: Normal pulses.     Heart sounds: Normal heart sounds. No murmur.  Pulmonary:     Effort: Pulmonary effort is normal.     Breath sounds: Normal air entry. No stridor, decreased air movement or transmitted upper airway sounds. No decreased breath sounds, wheezing or rhonchi.  Musculoskeletal:     Left hip: He exhibits decreased range of motion. He exhibits normal strength, no tenderness, no bony tenderness, no swelling, no crepitus, no deformity and no laceration.  Neurological:     Mental Status: He is alert.      Assessment & Plan:  Chronic left hip pain - Plan: DG Hip Unilat W OR W/O Pelvis 2-3 Views Left  Alopecia areata - Plan: Ambulatory referral to Dermatology  Patient's left hip pain is concerning for either a strain of his iliopsoas muscle or a possible tear of his labrum.  Recommended an x-ray of the left hip and if x-ray is completely normal I would begin conservatively with physical therapy.  If not improving with physical therapy I would recommend an MRI of the left hip to evaluate for possible labral tear.  Recommend dermatology consultation for possible intralesional corticosteroid injection for alopecia areata

## 2019-01-18 ENCOUNTER — Ambulatory Visit
Admission: RE | Admit: 2019-01-18 | Discharge: 2019-01-18 | Disposition: A | Discharge status: Home / Self Care | Payer: BC Managed Care – PPO | Source: Ambulatory Visit | Attending: Family Medicine | Admitting: Family Medicine

## 2019-01-18 DIAGNOSIS — G8929 Other chronic pain: Secondary | ICD-10-CM

## 2019-01-18 DIAGNOSIS — R1030 Lower abdominal pain, unspecified: Secondary | ICD-10-CM | POA: Diagnosis not present

## 2019-02-19 DIAGNOSIS — L638 Other alopecia areata: Secondary | ICD-10-CM | POA: Diagnosis not present

## 2019-02-25 ENCOUNTER — Ambulatory Visit: Payer: BC Managed Care – PPO | Admitting: Primary Care

## 2019-06-10 DIAGNOSIS — L638 Other alopecia areata: Secondary | ICD-10-CM | POA: Diagnosis not present

## 2019-07-05 ENCOUNTER — Telehealth: Payer: Self-pay | Admitting: Family Medicine

## 2019-07-05 DIAGNOSIS — M25552 Pain in left hip: Secondary | ICD-10-CM

## 2019-07-05 DIAGNOSIS — G8929 Other chronic pain: Secondary | ICD-10-CM

## 2019-07-05 NOTE — Telephone Encounter (Signed)
Patient called in requesting update to a referral that was supposed to been placed to Physical therapy in December 2020. Reviewed patient chart and saw that Xray results was called and left on patient's voicemail on 01/21/2019 but no referral was placed. Will place referral now at this time as patient would still like to proceed.

## 2019-07-18 ENCOUNTER — Encounter (HOSPITAL_COMMUNITY): Payer: Self-pay | Admitting: Physical Therapy

## 2019-07-18 ENCOUNTER — Ambulatory Visit (HOSPITAL_COMMUNITY): Payer: BC Managed Care – PPO | Attending: Family Medicine | Admitting: Physical Therapy

## 2019-07-18 ENCOUNTER — Other Ambulatory Visit: Payer: Self-pay

## 2019-07-18 DIAGNOSIS — M25552 Pain in left hip: Secondary | ICD-10-CM | POA: Insufficient documentation

## 2019-07-18 DIAGNOSIS — R2689 Other abnormalities of gait and mobility: Secondary | ICD-10-CM | POA: Diagnosis not present

## 2019-07-18 DIAGNOSIS — R29898 Other symptoms and signs involving the musculoskeletal system: Secondary | ICD-10-CM | POA: Diagnosis not present

## 2019-07-18 NOTE — Therapy (Signed)
[facility] [address] [place], [place], [address] Phone: [phone]   Fax:  [phone]  Physical Therapy Evaluation  Patient Details  Name: Samuel Cunningham MRN: [id number] Date of Birth: [birth year] Referring Provider (PT): Lynnea Ferrier MD   Encounter Date: 07/18/2019  PT End of Session - 07/18/19 0910    Visit Number  1    Number of Visits  4    Date for PT Re-Evaluation  08/15/19    Authorization Type  BCBS (VL 90 combined, no auth)    Authorization - Visit Number  1    Authorization - Number of Visits  90    PT Start Time  0815    PT Stop Time  0900    PT Time Calculation (min)  45 min    Activity Tolerance  Patient tolerated treatment well    Behavior During Therapy  [facility] for tasks assessed/performed       Past Medical History:  Diagnosis Date  . Allergy   . Anxiety     Past Surgical History:  Procedure Laterality Date  . REPAIR ANKLE LIGAMENT    . WISDOM TOOTH EXTRACTION      There were no vitals filed for this visit.   Subjective Assessment - 07/18/19 0811    Subjective  Patient is a 29 y.o. male who presents to physical therapy with c/o left hip pain. Patient states he is a runner and sometimes he runs and it doesn't bother him. He was running a half marathon several months ago and around mile 8 his hip started bothering him. He can run 3-4 miles and his hip bothers him with rest and with transfers following run. It feels like stabbing pain. Pain is on inside of his hip/leg. He has tried stretching but that hasn't helped much. His main goal is to get back to running. He notices moving his leg in bothers him.    Limitations  Sitting;Walking   running   Patient Stated Goals  Get back to running    Currently in Pain?  No/denies   worst: 4/10        [facility] PT Assessment - 07/18/19 0001      Assessment   Medical Diagnosis  L hip pain    Referring Provider (PT)  Lynnea Ferrier MD    Onset Date/Surgical Date   05/18/19    Next MD Visit  none scheduled    Prior Therapy  none      Precautions   Precautions  None      Restrictions   Weight Bearing Restrictions  No      Balance Screen   Has the patient fallen in the past 6 months  No    Has the patient had a decrease in activity level because of a fear of falling?   No    Is the patient reluctant to leave their home because of a fear of falling?   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      Cognition   Overall Cognitive Status  Within Functional Limits for tasks assessed      Observation/Other Assessments   Observations  ambulates without AD    Focus on Therapeutic Outcomes (FOTO)   15% limited      ROM / Strength   AROM / PROM / Strength  AROM;Strength  AROM   Overall AROM   Within functional limits for tasks performed      Strength   Strength Assessment Site  Hip;Knee;Ankle    Right/Left Hip  Right;Left    Right Hip Flexion  4+/5    Right Hip Extension  4+/5    Right Hip ABduction  4+/5    Left Hip Flexion  4+/5    Left Hip Extension  4+/5    Left Hip ABduction  4+/5    Right/Left Knee  Right;Left    Right Knee Flexion  5/5    Right Knee Extension  5/5    Left Knee Flexion  5/5    Left Knee Extension  5/5    Right/Left Ankle  Right;Left    Right Ankle Dorsiflexion  5/5    Left Ankle Dorsiflexion  5/5      Palpation   Palpation comment  TTP proximal L adductor and L hip flexor      Transfers   Comments  squat weight shift of LLE minimally      Ambulation/Gait   Gait Comments  forward step down test - able to complete with minimal knee valgus bilaterally                  Objective measurements completed on examination: See above findings.      [facility] Adult PT Treatment/Exercise - 07/18/19 0001      Exercises   Exercises  Knee/Hip      Knee/Hip Exercises: Stretches   Other Knee/Hip Stretches  quadruped  hip adductor stretch with rocking 10x 10 second holds      Knee/Hip Exercises: Prone   Straight Leg Raises  AROM;10 reps;Both    Straight Leg Raises Limitations  5 second holds    Other Prone Exercises  Prone heel squeeze 10x10 second holds      Manual Therapy   Manual Therapy  Soft tissue mobilization    Manual therapy comments  completed independently from all other aspects of treatment    Soft tissue mobilization  L proximal hip adductor self STM instruction and demonstration              PT Education - 07/18/19 0810    Education Details  Patient educated on exam findings, POC, scope of PT, hip pathology, initial HEP, squat mechanics, mechanics of HEP.    Person(s) Educated  Patient    Methods  Explanation;Demonstration;Handout;Tactile cues;Verbal cues    Comprehension  Verbalized understanding;Returned demonstration       PT Short Term Goals - 07/18/19 0786      PT SHORT TERM GOAL #1   Title  Patient will be independent with HEP in order to improve functional outcomes.    Time  2    Period  Weeks    Status  New    Target Date  08/01/19      PT SHORT TERM GOAL #2   Title  Patient will report at least 25% improvement in symptoms for improved quality of life.    Time  2    Period  Weeks    Status  New    Target Date  08/01/19        PT Long Term Goals - 07/18/19 0929      PT LONG TERM GOAL #1   Title  Patient will report at least 75% improvement in symptoms for improved quality of life.    Time  4    Period  Weeks  Status  New    Target Date  08/15/19      PT LONG TERM GOAL #2   Title  Patient will improve FOTO score by at least 5 points in order to indicate improved tolerance to activity.    Time  4    Period  Weeks    Status  New    Target Date  08/15/19      PT LONG TERM GOAL #3   Title  Patient will be able to return to running at least 4 miles with symptoms no great than 2/10 following for improved exercise tolerance.    Time  4    Period   Weeks    Status  New    Target Date  08/15/19             Plan - 07/18/19 1829    Clinical Impression Statement  Patient is a 29 y.o. male who presents to physical therapy with c/o left hip pain that began several months ago following running. He presents with pain limited deficits in L hip strength, hyperactive hip musculature, activity tolerance, ROM, impaired body mechanics, and functional mobility with ADL. He is having to modify and restrict ADL as indicated by FOTO score as well as subjective information and objective measures which is affecting overall participation. Patient will benefit from skilled physical therapy in order to improve function and reduce impairment.    Personal Factors and Comorbidities  Time since onset of injury/illness/exacerbation;Profession;Behavior Pattern    Examination-Activity Limitations  Squat;Stairs;Locomotion Level;Transfers;Other   running   Examination-Participation Restrictions  Tour manager   running   Stability/Clinical Decision Making  Stable/Uncomplicated    Clinical Decision Making  Low    Rehab Potential  Good    PT Frequency  1x / week    PT Duration  4 weeks    PT Treatment/Interventions  ADLs/Self Care Home Management;Aquatic Therapy;Biofeedback;Canalith Repostioning;Cryotherapy;Electrical Stimulation;Iontophoresis 4mg /ml Dexamethasone;Moist Heat;Traction;Gait training;Stair training;Functional mobility training;Therapeutic activities;Ultrasound;Therapeutic exercise;Balance training;Neuromuscular re-education;Patient/family education;Orthotic Fit/Training;Manual techniques;Passive range of motion;Dry needling;Energy conservation;Splinting;Taping;Spinal Manipulations;Joint Manipulations    PT Next Visit Plan  assess response to HEP, continue hip adductor and mobility exercises, continue glute, ER/IR strengthening    PT Home Exercise Plan  07/18/19 hip adductor STM, hip adductor stretch in quadruped with rocking, prone heel squeeze, prone  hip extension    Consulted and Agree with Plan of Care  Patient       Patient will benefit from skilled therapeutic intervention in order to improve the following deficits and impairments:  Decreased activity tolerance, Decreased mobility, Increased muscle spasms, Difficulty walking, Pain, Decreased strength, Decreased endurance  Visit Diagnosis: Pain in left hip  Other abnormalities of gait and mobility  Other symptoms and signs involving the musculoskeletal system     Problem List Patient Active Problem List   Diagnosis Date Noted  . Current smoker 07/20/2018  . Intermittent asthma without complication 93/71/6967  . Persistent cough for 3 weeks or longer 05/16/2018  . Allergy     9:33 AM, 07/18/19 Mearl Latin PT, DPT Physical Therapist at [place], [place], [address] Phone: [phone]   Fax:  [phone]  Name: Samuel Cunningham MRN: [id number] Date of Birth: Jun 02, 1990

## 2019-07-18 NOTE — Patient Instructions (Signed)
Access Code: [email or web address] URL: [email or web address] Date: 07/18/2019 Prepared by: Greig Castilla Girard Koontz  Exercises Quadruped Rocking Backward - 1 x daily - 7 x weekly - 10-20 reps - 10 second hold Prone Heel Squeeze - 1 x daily - 7 x weekly - 10 reps - 10 second hold Prone Hip Extension - 1 x daily - 7 x weekly - 10 reps - 5 second hold Supine Hip Adductor Stretch - 1 x daily - 7 x weekly

## 2019-07-25 ENCOUNTER — Ambulatory Visit (HOSPITAL_COMMUNITY): Payer: BC Managed Care – PPO | Admitting: Physical Therapy

## 2019-07-25 ENCOUNTER — Other Ambulatory Visit: Payer: Self-pay

## 2019-07-25 DIAGNOSIS — M25552 Pain in left hip: Secondary | ICD-10-CM

## 2019-07-25 DIAGNOSIS — R2689 Other abnormalities of gait and mobility: Secondary | ICD-10-CM

## 2019-07-25 DIAGNOSIS — R29898 Other symptoms and signs involving the musculoskeletal system: Secondary | ICD-10-CM

## 2019-07-25 NOTE — Therapy (Signed)
[place], [place], [address] Phone: [phone]   Fax:  [phone]  Physical Therapy Treatment  Patient Details  Name: Samuel Cunningham MRN: [id number] Date of Birth: 1990-11-13 Referring Provider (PT): Jenna Luo MD   Encounter Date: 07/25/2019   PT End of Session - 07/25/19 0900    Visit Number 2    Number of Visits 4    Date for PT Re-Evaluation 08/15/19    Authorization Type BCBS (VL 90 combined, no auth)    Authorization - Visit Number 2    Authorization - Number of Visits 102    PT Start Time [phone]    PT Stop Time 0912    PT Time Calculation (min) 39 min    Activity Tolerance Patient tolerated treatment well    Behavior During Therapy [facility] for tasks assessed/performed           Past Medical History:  Diagnosis Date  . Allergy   . Anxiety     Past Surgical History:  Procedure Laterality Date  . REPAIR ANKLE LIGAMENT    . WISDOM TOOTH EXTRACTION      There were no vitals filed for this visit.   Subjective Assessment - 07/25/19 0834    Subjective PT has been doing his self massage but he has a new born and has not been  able to really do much of the exercises    Limitations Sitting;Walking   running   Patient Stated Goals Get back to running    Currently in Pain? No/denies                             [facility] Adult PT Treatment/Exercise - 07/25/19 0001      Exercises   Exercises Knee/Hip      Knee/Hip Exercises: Stretches   Piriformis Stretch Both;1 rep;60 seconds    Piriformis Stretch Limitations quadriped     Other Knee/Hip Stretches butterfly stretch     Other Knee/Hip Stretches Ambulance person       Knee/Hip Exercises: Machines for Strengthening   Cybex Leg Press 5pl x 10       Knee/Hip Exercises: Standing   Forward Lunges Both;10 reps    Forward Lunges Limitations onto bosu    Side Lunges Both;10 reps    Side Lunges Limitations onto bosu     Rocker Board 2  minutes    SLS with Vectors x 15 " B  x 3     Other Standing Knee Exercises side step with green t band x 3       Knee/Hip Exercises: Sidelying   Clams  B clam x 5                   PT Education - 07/25/19 0900    Education Details HEP            PT Short Term Goals - 07/25/19 0854      PT SHORT TERM GOAL #1   Title Patient will be independent with HEP in order to improve functional outcomes.    Time 2    Period Weeks    Status On-going    Target Date 08/01/19      PT SHORT TERM GOAL #2   Title Patient will report at least 25% improvement in symptoms for improved quality of life.    Time 2    Period Weeks  Status On-going    Target Date 08/01/19             PT Long Term Goals - 07/25/19 0855      PT LONG TERM GOAL #1   Title Patient will report at least 75% improvement in symptoms for improved quality of life.    Time 4    Period Weeks    Status On-going      PT LONG TERM GOAL #2   Title Patient will improve FOTO score by at least 5 points in order to indicate improved tolerance to activity.    Time 4    Period Weeks    Status On-going      PT LONG TERM GOAL #3   Title Patient will be able to return to running at least 4 miles with symptoms no great than 2/10 following for improved exercise tolerance.    Time 4    Period Weeks    Status On-going                 Plan - 07/25/19 0901    Clinical Impression Statement Pt has noted lumbar instability while completing hip strengthening exercises.  Pt had to be verbally corrected several times throughout session to focus on forcing hip mm to complete work and not pull in his back.    Personal Factors and Comorbidities Time since onset of injury/illness/exacerbation;Profession;Behavior Pattern    Examination-Activity Limitations Squat;Stairs;Locomotion Level;Transfers;Other   running   Examination-Participation Restrictions Contractor   running   Stability/Clinical Decision Making  Stable/Uncomplicated    Rehab Potential Good    PT Frequency 1x / week    PT Duration 4 weeks    PT Treatment/Interventions ADLs/Self Care Home Management;Aquatic Therapy;Biofeedback;Canalith Repostioning;Cryotherapy;Electrical Stimulation;Iontophoresis 4mg /ml Dexamethasone;Moist Heat;Traction;Gait training;Stair training;Functional mobility training;Therapeutic activities;Ultrasound;Therapeutic exercise;Balance training;Neuromuscular re-education;Patient/family education;Orthotic Fit/Training;Manual techniques;Passive range of motion;Dry needling;Energy conservation;Splinting;Taping;Spinal Manipulations;Joint Manipulations    PT Next Visit Plan assess response to HEP, continue hip adductor and mobility exercises, continue glute, ER/IR strengthening    PT Home Exercise Plan 07/18/19 hip adductor STM, hip adductor stretch in quadruped with rocking, prone heel squeeze, prone hip extension; 6/10: side stepping with t band, vector stance, quadriped opposite arm /leg and piriformis stretch.    Consulted and Agree with Plan of Care Patient           Patient will benefit from skilled therapeutic intervention in order to improve the following deficits and impairments:  Decreased activity tolerance, Decreased mobility, Increased muscle spasms, Difficulty walking, Pain, Decreased strength, Decreased endurance  Visit Diagnosis: Other abnormalities of gait and mobility  Pain in left hip  Other symptoms and signs involving the musculoskeletal system     Problem List Patient Active Problem List   Diagnosis Date Noted  . Current smoker 07/20/2018  . Intermittent asthma without complication 06/28/2018  . Persistent cough for 3 weeks or longer 05/16/2018  . Allergy     07/16/2018, PT CLT [phone] 07/25/2019, 9:20 AM  [facility] [address] [place], [place], [place] Phone: [phone]   Fax:  [phone]  Name: Samuel Cunningham MRN:  [name] Date of Birth: 08-16-1990

## 2019-07-29 ENCOUNTER — Telehealth (HOSPITAL_COMMUNITY): Payer: Self-pay | Admitting: Physical Therapy

## 2019-07-29 ENCOUNTER — Ambulatory Visit (HOSPITAL_COMMUNITY): Payer: BC Managed Care – PPO | Admitting: Physical Therapy

## 2019-07-29 NOTE — Telephone Encounter (Signed)
Patient No Show #1; Called patient to make him aware of missed appointment and reminded him of next appointment.  11:12 AM, 07/29/19 Wyman Songster PT, DPT Physical Therapist at [facility]

## 2019-07-31 ENCOUNTER — Encounter (HOSPITAL_COMMUNITY): Payer: Self-pay | Admitting: Physical Therapy

## 2019-07-31 ENCOUNTER — Ambulatory Visit (HOSPITAL_COMMUNITY): Payer: BC Managed Care – PPO | Admitting: Physical Therapy

## 2019-07-31 ENCOUNTER — Other Ambulatory Visit: Payer: Self-pay

## 2019-07-31 DIAGNOSIS — R29898 Other symptoms and signs involving the musculoskeletal system: Secondary | ICD-10-CM

## 2019-07-31 DIAGNOSIS — M25552 Pain in left hip: Secondary | ICD-10-CM

## 2019-07-31 DIAGNOSIS — R2689 Other abnormalities of gait and mobility: Secondary | ICD-10-CM

## 2019-07-31 NOTE — Therapy (Signed)
[place], [place], [address] Phone: [phone]   Fax:  [phone]  Physical Therapy Treatment  Patient Details  Name: Samuel CUDWORTH MRN: [id number] Date of Birth: [birth year] Referring Provider (PT): Jenna Luo MD   Encounter Date: 07/31/2019   PT End of Session - 07/31/19 0819    Visit Number 3    Number of Visits 4    Date for PT Re-Evaluation 08/15/19    Authorization Type BCBS (VL 90 combined, no auth)    Authorization - Visit Number 3    Authorization - Number of Visits 38    PT Start Time 0818    PT Stop Time 0859    PT Time Calculation (min) 41 min    Activity Tolerance Patient tolerated treatment well    Behavior During Therapy [facility] for tasks assessed/performed           Past Medical History:  Diagnosis Date  . Allergy   . Anxiety     Past Surgical History:  Procedure Laterality Date  . REPAIR ANKLE LIGAMENT    . WISDOM TOOTH EXTRACTION      There were no vitals filed for this visit.   Subjective Assessment - 07/31/19 0819    Subjective Patient reported he has been trying to do his exercises, but he has a new born and this makes it difficult.    Limitations Sitting;Walking   running   Patient Stated Goals Get back to running    Currently in Pain? No/denies                             [facility] Adult PT Treatment/Exercise - 07/31/19 0001      Knee/Hip Exercises: Stretches   Piriformis Stretch Both;1 rep;60 seconds    Piriformis Stretch Limitations quadruped     Other Knee/Hip Stretches butterfly stretch 3x30    Other Knee/Hip Stretches Long sit HS stretch 2x30'' each       Knee/Hip Exercises: Standing   Forward Lunges Both;10 reps    Forward Lunges Limitations onto bosu    Side Lunges Both;10 reps    Side Lunges Limitations onto bosu     SLS with Vectors x15 B x5    Other Standing Knee Exercises Heel tap 2x15 on 4'' step with mirror       Knee/Hip  Exercises: Supine   Other Supine Knee/Hip Exercises Contract relax for hip adduction 10x10'' 2 sets with ball between knees      Knee/Hip Exercises: Sidelying   Clams RTB 2x15                     PT Short Term Goals - 07/25/19 8299      PT SHORT TERM GOAL #1   Title Patient will be independent with HEP in order to improve functional outcomes.    Time 2    Period Weeks    Status On-going    Target Date 08/01/19      PT SHORT TERM GOAL #2   Title Patient will report at least 25% improvement in symptoms for improved quality of life.    Time 2    Period Weeks    Status On-going    Target Date 08/01/19             PT Long Term Goals - 07/25/19 0855      PT LONG TERM GOAL #1  Title Patient will report at least 75% improvement in symptoms for improved quality of life.    Time 4    Period Weeks    Status On-going      PT LONG TERM GOAL #2   Title Patient will improve FOTO score by at least 5 points in order to indicate improved tolerance to activity.    Time 4    Period Weeks    Status On-going      PT LONG TERM GOAL #3   Title Patient will be able to return to running at least 4 miles with symptoms no great than 2/10 following for improved exercise tolerance.    Time 4    Period Weeks    Status On-going                 Plan - 07/31/19 0905    Clinical Impression Statement Focused on hip strengthening with an endurance focus this session as patient's hip pain occurs after he has been running for a while. Also added contract relax with ball for adductors as well as stretching for medial hamstring. Patient tolerated exercises well, but reported some tightness with lunges on BOSU this session. Plan to re-assess patient next session.    Personal Factors and Comorbidities Time since onset of injury/illness/exacerbation;Profession;Behavior Pattern    Examination-Activity Limitations Squat;Stairs;Locomotion Level;Transfers;Other   running    Examination-Participation Restrictions Contractor   running   Stability/Clinical Decision Making Stable/Uncomplicated    Rehab Potential Good    PT Frequency 1x / week    PT Duration 4 weeks    PT Treatment/Interventions ADLs/Self Care Home Management;Aquatic Therapy;Biofeedback;Canalith Repostioning;Cryotherapy;Electrical Stimulation;Iontophoresis 4mg /ml Dexamethasone;Moist Heat;Traction;Gait training;Stair training;Functional mobility training;Therapeutic activities;Ultrasound;Therapeutic exercise;Balance training;Neuromuscular re-education;Patient/family education;Orthotic Fit/Training;Manual techniques;Passive range of motion;Dry needling;Energy conservation;Splinting;Taping;Spinal Manipulations;Joint Manipulations    PT Next Visit Plan Re-assess next visit    PT Home Exercise Plan 07/18/19 hip adductor STM, hip adductor stretch in quadruped with rocking, prone heel squeeze, prone hip extension; 6/10: side stepping with t band, vector stance, quadriped opposite arm /leg and piriformis stretch.    Consulted and Agree with Plan of Care Patient           Patient will benefit from skilled therapeutic intervention in order to improve the following deficits and impairments:  Decreased activity tolerance, Decreased mobility, Increased muscle spasms, Difficulty walking, Pain, Decreased strength, Decreased endurance  Visit Diagnosis: Other abnormalities of gait and mobility  Pain in left hip  Other symptoms and signs involving the musculoskeletal system     Problem List Patient Active Problem List   Diagnosis Date Noted  . Current smoker 07/20/2018  . Intermittent asthma without complication 06/28/2018  . Persistent cough for 3 weeks or longer 05/16/2018  . Allergy    07/16/2018 PT, DPT 9:06 AM, 07/31/19 [phone]  [facility] [address] [place], [place], [place] Phone: [phone]   Fax:  [phone]  Name: Samuel Cunningham MRN: [name] Date of Birth: September 14, 1990

## 2019-08-06 ENCOUNTER — Telehealth (HOSPITAL_COMMUNITY): Payer: Self-pay | Admitting: Physical Therapy

## 2019-08-06 ENCOUNTER — Ambulatory Visit (HOSPITAL_COMMUNITY): Payer: BC Managed Care – PPO | Admitting: Physical Therapy

## 2019-08-06 NOTE — Telephone Encounter (Signed)
Patient no show, left voicemail for patient making him aware of missed appointment and reminding him of next appointment.  8:48 AM, 08/06/19 Wyman Songster PT, DPT Physical Therapist at [facility]

## 2019-08-08 ENCOUNTER — Encounter (HOSPITAL_COMMUNITY): Payer: BC Managed Care – PPO | Admitting: Physical Therapy

## 2019-08-12 ENCOUNTER — Encounter (HOSPITAL_COMMUNITY): Payer: Self-pay | Admitting: Physical Therapy

## 2019-08-12 ENCOUNTER — Other Ambulatory Visit: Payer: Self-pay

## 2019-08-12 ENCOUNTER — Ambulatory Visit (HOSPITAL_COMMUNITY): Payer: BC Managed Care – PPO | Admitting: Physical Therapy

## 2019-08-12 DIAGNOSIS — R2689 Other abnormalities of gait and mobility: Secondary | ICD-10-CM | POA: Diagnosis not present

## 2019-08-12 DIAGNOSIS — R29898 Other symptoms and signs involving the musculoskeletal system: Secondary | ICD-10-CM

## 2019-08-12 DIAGNOSIS — M25552 Pain in left hip: Secondary | ICD-10-CM

## 2019-08-12 NOTE — Therapy (Signed)
[place], [place], [address] Phone: [phone]   Fax:  [phone]  Physical Therapy Treatment/Recert  Patient Details  Name: Samuel Cunningham MRN: [id number] Date of Birth: 1990-02-27 Referring Provider (PT): Jenna Luo MD   Encounter Date: 08/12/2019   PT End of Session - 08/12/19 0818    Visit Number 4    Number of Visits 8    Date for PT Re-Evaluation 09/09/19    Authorization Type BCBS (VL 90 combined, no auth)    Authorization - Visit Number 4    Authorization - Number of Visits 26    PT Start Time [phone]    PT Stop Time 0855    PT Time Calculation (min) 41 min    Activity Tolerance Patient tolerated treatment well    Behavior During Therapy [facility] for tasks assessed/performed           Past Medical History:  Diagnosis Date  . Allergy   . Anxiety     Past Surgical History:  Procedure Laterality Date  . REPAIR ANKLE LIGAMENT    . WISDOM TOOTH EXTRACTION      There were no vitals filed for this visit.   Subjective Assessment - 08/12/19 0811    Subjective Patient states he feels a little improvement in symptoms but he continues to have some pain. He states 50% improvement in symptoms with physical therapy intervention. Patient states he has been slacking on the exercises. He has been running more but it has not been as bad after. He wants to continue therapy.    Limitations Sitting;Walking   running   Patient Stated Goals Get back to running    Currently in Pain? No/denies              [facility] PT Assessment - 08/12/19 0001      Assessment   Medical Diagnosis L hip pain    Referring Provider (PT) Jenna Luo MD    Onset Date/Surgical Date 05/18/19    Next MD Visit none scheduled    Prior Therapy none      Precautions   Precautions None      Restrictions   Weight Bearing Restrictions No      Balance Screen   Has the patient fallen in the past 6 months No    Has the patient had  a decrease in activity level because of a fear of falling?  No    Is the patient reluctant to leave their home because of a fear of falling?  No      Prior Function   Level of Independence Independent    Vocation Full time employment    Vocation Requirements line work      Cognition   Overall Cognitive Status Within Functional Limits for tasks assessed      Observation/Other Assessments   Observations ambulates without AD    Focus on Therapeutic Outcomes (FOTO)  22% limited      AROM   Overall AROM  Within functional limits for tasks performed      Strength   Right Hip Flexion 4+/5    Right Hip Extension 4+/5    Right Hip ABduction 4+/5    Left Hip Flexion 4+/5    Left Hip Extension 4+/5    Left Hip ABduction 4+/5    Right Knee Flexion 5/5    Right Knee Extension 5/5    Left Knee Flexion 5/5    Left Knee Extension 5/5  Right Ankle Dorsiflexion 5/5    Left Ankle Dorsiflexion 5/5                         [facility] Adult PT Treatment/Exercise - 08/12/19 0001      Knee/Hip Exercises: Standing   Forward Lunges Both;10 reps    Forward Lunges Limitations onto bosu    Side Lunges Both;10 reps;2 sets    Side Lunges Limitations onto bosu     Step Down 10 reps;Hand Hold: 0;Step Height: 6";3 sets    Step Down Limitations eccentric control    Other Standing Knee Exercises lunge slide outs 2x10 bilateral; SLS mini squat with vectors 2x5 bilateral      Knee/Hip Exercises: Prone   Other Prone Exercises bird dog 2x10 bilateral with core activation; fire hydrant 2x10 bilateral in quadruped                  PT Education - 08/12/19 0811    Education Details Patient educated on POC, mechanics of exercise, continuing HEP    Person(s) Educated Patient    Methods Explanation;Demonstration    Comprehension Verbalized understanding;Returned demonstration            PT Short Term Goals - 08/12/19 0825      PT SHORT TERM GOAL #1   Title Patient will be  independent with HEP in order to improve functional outcomes.    Time 2    Period Weeks    Status Achieved    Target Date 08/01/19      PT SHORT TERM GOAL #2   Title Patient will report at least 25% improvement in symptoms for improved quality of life.    Time 2    Period Weeks    Status Achieved    Target Date 08/01/19             PT Long Term Goals - 08/12/19 0826      PT LONG TERM GOAL #1   Title Patient will report at least 75% improvement in symptoms for improved quality of life.    Time 4    Period Weeks    Status On-going      PT LONG TERM GOAL #2   Title Patient will improve FOTO score by at least 5 points in order to indicate improved tolerance to activity.    Time 4    Period Weeks    Status On-going      PT LONG TERM GOAL #3   Title Patient will be able to return to running at least 4 miles with symptoms no great than 2/10 following for improved exercise tolerance.    Time 4    Period Weeks    Status On-going                 Plan - 08/12/19 1275    Clinical Impression Statement Patient has met 2/2 short term goals with improvement in symptoms and ability to complete HEP. He has met 0/3 long term goals due to continued symptoms, decreased activity tolerance, and difficulty running and following running. Patient continues to show impaired hip strength which is likely contributing to symptoms. Patient would also benefit for more consistent completion of HEP. Patient requires min verbal cueing for decreasing lumbar spine movement with bird dog exercise which improves mechanics following. He completes lunge slide outs with good mechanics and does not require UE support to complete. He requires verbal cueing for motor control with SLS with  mini squat vectors and is able to complete without UE support. Patient able to progress to increased step height with emphasis on eccentric control. Patient will continue to benefit from skilled physical therapy in order to  reduce impairment and improve function.    Personal Factors and Comorbidities Time since onset of injury/illness/exacerbation;Profession;Behavior Pattern    Examination-Activity Limitations Squat;Stairs;Locomotion Level;Transfers;Other   running   Examination-Participation Restrictions Tour manager   running   Stability/Clinical Decision Making Stable/Uncomplicated    Rehab Potential Good    PT Frequency 1x / week    PT Duration 4 weeks    PT Treatment/Interventions ADLs/Self Care Home Management;Aquatic Therapy;Biofeedback;Canalith Repostioning;Cryotherapy;Electrical Stimulation;Iontophoresis 36m/ml Dexamethasone;Moist Heat;Traction;Gait training;Stair training;Functional mobility training;Therapeutic activities;Ultrasound;Therapeutic exercise;Balance training;Neuromuscular re-education;Patient/family education;Orthotic Fit/Training;Manual techniques;Passive range of motion;Dry needling;Energy conservation;Splinting;Taping;Spinal Manipulations;Joint Manipulations    PT Next Visit Plan continue with hip strengthing and mobility    PT Home Exercise Plan 07/18/19 hip adductor STM, hip adductor stretch in quadruped with rocking, prone heel squeeze, prone hip extension; 6/10: side stepping with t band, vector stance, quadriped opposite arm /leg and piriformis stretch. 6/28 lunge slide outs, SLS with mini squat and vectors    Consulted and Agree with Plan of Care Patient           Patient will benefit from skilled therapeutic intervention in order to improve the following deficits and impairments:  Decreased activity tolerance, Decreased mobility, Increased muscle spasms, Difficulty walking, Pain, Decreased strength, Decreased endurance  Visit Diagnosis: Other abnormalities of gait and mobility  Pain in left hip  Other symptoms and signs involving the musculoskeletal system     Problem List Patient Active Problem List   Diagnosis Date Noted  . Current smoker 07/20/2018  . Intermittent  asthma without complication [id number] . Persistent cough for 3 weeks or longer 05/16/2018  . Allergy     8:57 AM, 08/12/19 AMearl LatinPT, DPT Physical Therapist [place] [id number]: [phone]  Fax:  [phone] Name: Samuel SCURLOCKMRN: [id number] of Birth: [birth year]

## 2019-08-13 ENCOUNTER — Encounter (HOSPITAL_COMMUNITY): Payer: BC Managed Care – PPO

## 2019-08-15 ENCOUNTER — Encounter (HOSPITAL_COMMUNITY): Payer: BC Managed Care – PPO | Admitting: Physical Therapy

## 2019-08-26 ENCOUNTER — Telehealth (HOSPITAL_COMMUNITY): Payer: Self-pay | Admitting: Physical Therapy

## 2019-08-26 NOTE — Telephone Encounter (Signed)
Pt l/m requested we call him back - I called voice mail was full and unable to leave a message

## 2019-08-27 ENCOUNTER — Encounter (HOSPITAL_COMMUNITY): Payer: BC Managed Care – PPO

## 2019-08-27 ENCOUNTER — Telehealth (HOSPITAL_COMMUNITY): Payer: Self-pay

## 2019-08-27 NOTE — Telephone Encounter (Signed)
No Show#1. Therapist called pt regarding missed appointment this morning at 8:15, but no answer so left voicemail with clinic number if pt has further questions and wishing pt well.  Domenick Bookbinder PT, DPT 08/27/19, 8:37 AM [phone]

## 2019-09-02 ENCOUNTER — Encounter (HOSPITAL_COMMUNITY): Payer: BC Managed Care – PPO | Admitting: Physical Therapy

## 2019-09-02 ENCOUNTER — Telehealth (HOSPITAL_COMMUNITY): Payer: Self-pay | Admitting: Physical Therapy

## 2019-09-02 NOTE — Telephone Encounter (Signed)
Patient no show. Patient voicemail full and unable to leave message making patient aware of missed appointment.  8:45 AM, 09/02/19 Wyman Songster PT, DPT Physical Therapist at [facility]

## 2019-09-11 ENCOUNTER — Encounter (HOSPITAL_COMMUNITY): Payer: BC Managed Care – PPO | Admitting: Physical Therapy

## 2019-09-11 ENCOUNTER — Telehealth (HOSPITAL_COMMUNITY): Payer: Self-pay | Admitting: Physical Therapy

## 2019-09-11 ENCOUNTER — Encounter (HOSPITAL_COMMUNITY): Payer: Self-pay | Admitting: Physical Therapy

## 2019-09-11 NOTE — Telephone Encounter (Signed)
Patient no show, called patient to make him aware of missed appointment but voicemail was full. Patient called back and made him aware of no show policy and that he would need a new order to return. Educated patient on continuing HEP for a few months and getting new order to return if necessary. Patient agreeable to discharge.   8:39 AM, 09/11/19 Wyman Songster PT, DPT Physical Therapist at [facility]

## 2019-09-11 NOTE — Therapy (Signed)
[place], [place], [address] Phone: [phone]   Fax:  [phone]  Patient Details  Name: Samuel Cunningham MRN: [id number] Date of Birth: 06/05/1990 Referring Provider:  No ref. provider found  Encounter Date: 09/11/2019   PHYSICAL THERAPY DISCHARGE SUMMARY  Visits from Start of Care: 4  Current functional level related to goals / functional outcomes: As of 08/12/19 at last reassessment, patient has met 2/2 short term goals with improvement in symptoms and ability to complete HEP. He has met 0/3 long term goals due to continued symptoms, decreased activity tolerance, and difficulty running and following running.   Remaining deficits: continued symptoms, decreased activity tolerance, and difficulty running and following running; unable to fully assess status due to patient not returning over last month.   Education / Equipment: Patient has been educated on continuing HEP and getting new order for physical therapy if symptoms persist.  Plan: Patient agrees to discharge.  Patient goals were partially met. Patient is being discharged due to not returning since the last visit.  ?????       8:43 AM, 09/11/19 Mearl Latin PT, DPT Physical Therapist at [place], [place], [address] Phone: [phone]   Fax:  [phone]

## 2019-09-16 ENCOUNTER — Encounter (HOSPITAL_COMMUNITY): Payer: BC Managed Care – PPO | Admitting: Physical Therapy

## 2019-11-26 ENCOUNTER — Ambulatory Visit (INDEPENDENT_AMBULATORY_CARE_PROVIDER_SITE_OTHER): Payer: BC Managed Care – PPO | Admitting: Psychology

## 2019-11-26 DIAGNOSIS — F1020 Alcohol dependence, uncomplicated: Secondary | ICD-10-CM

## 2019-12-20 ENCOUNTER — Ambulatory Visit: Payer: BC Managed Care – PPO | Admitting: Psychology

## 2020-01-23 ENCOUNTER — Other Ambulatory Visit: Payer: Self-pay | Admitting: Family Medicine

## 2020-06-05 IMAGING — CR DG HIP (WITH OR WITHOUT PELVIS) 2-3V*L*
2 series · 2 of 2 positions shown · non-contrast
Comparison: None.

CLINICAL DATA: Left groin pain after running for approximately 4
months.

EXAM:
DG HIP (WITH OR WITHOUT PELVIS) 2-3V LEFT

[w hip ap left]
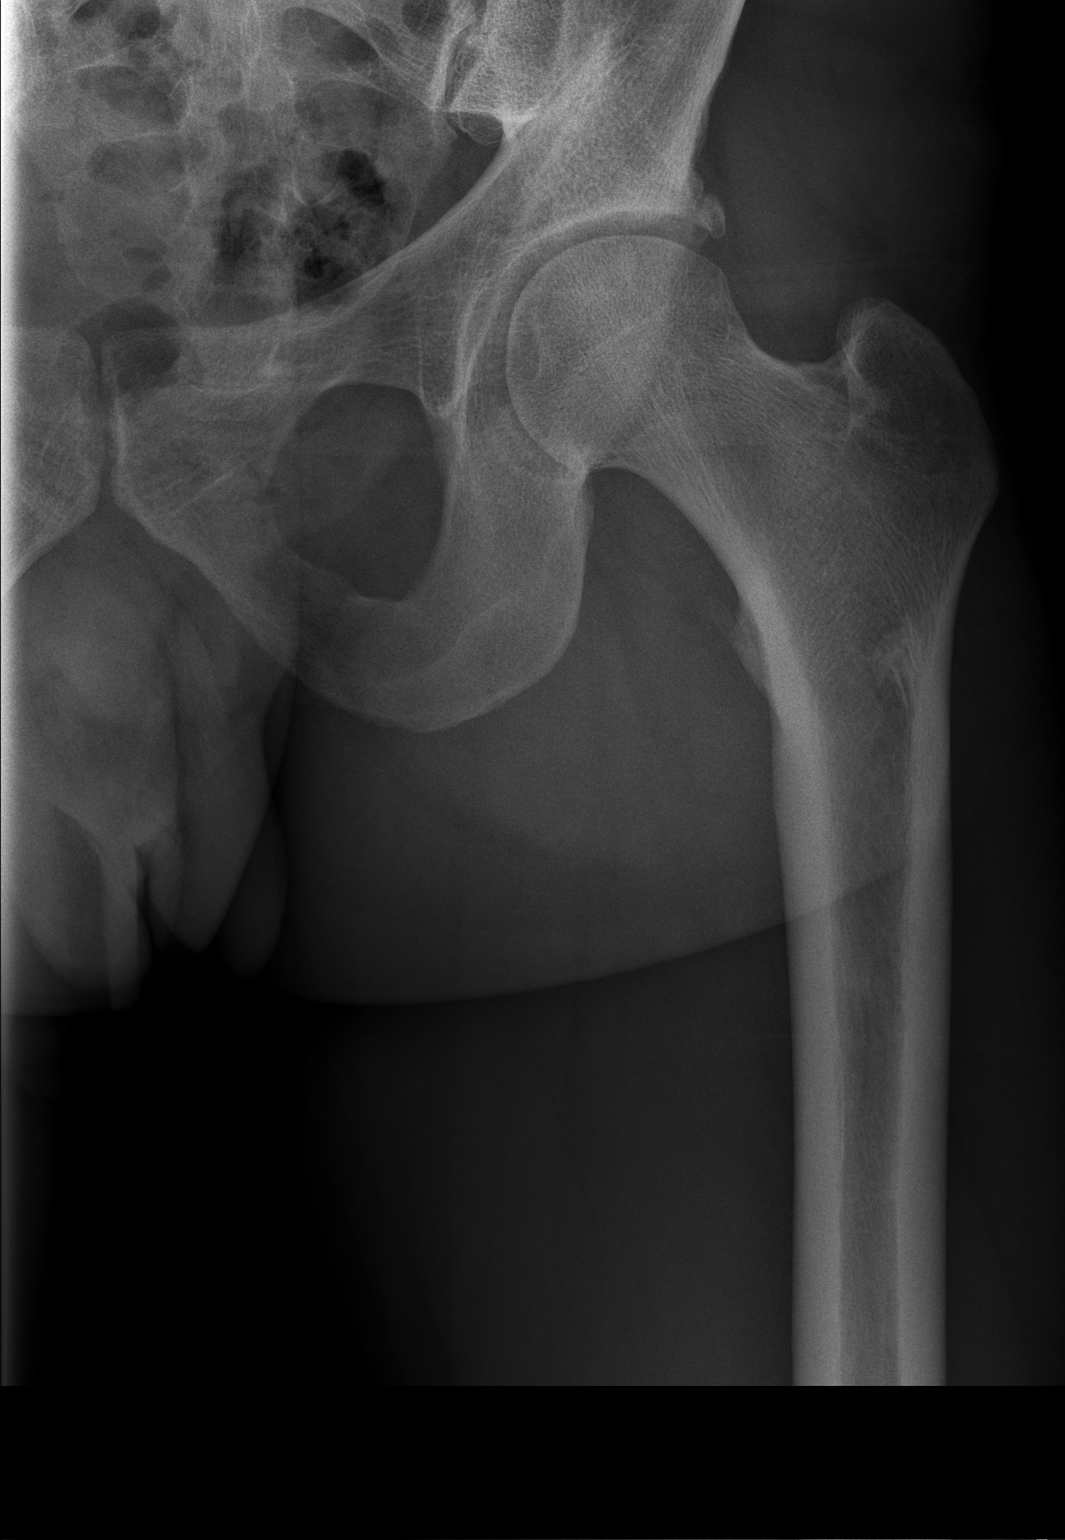

[w hip lat left]
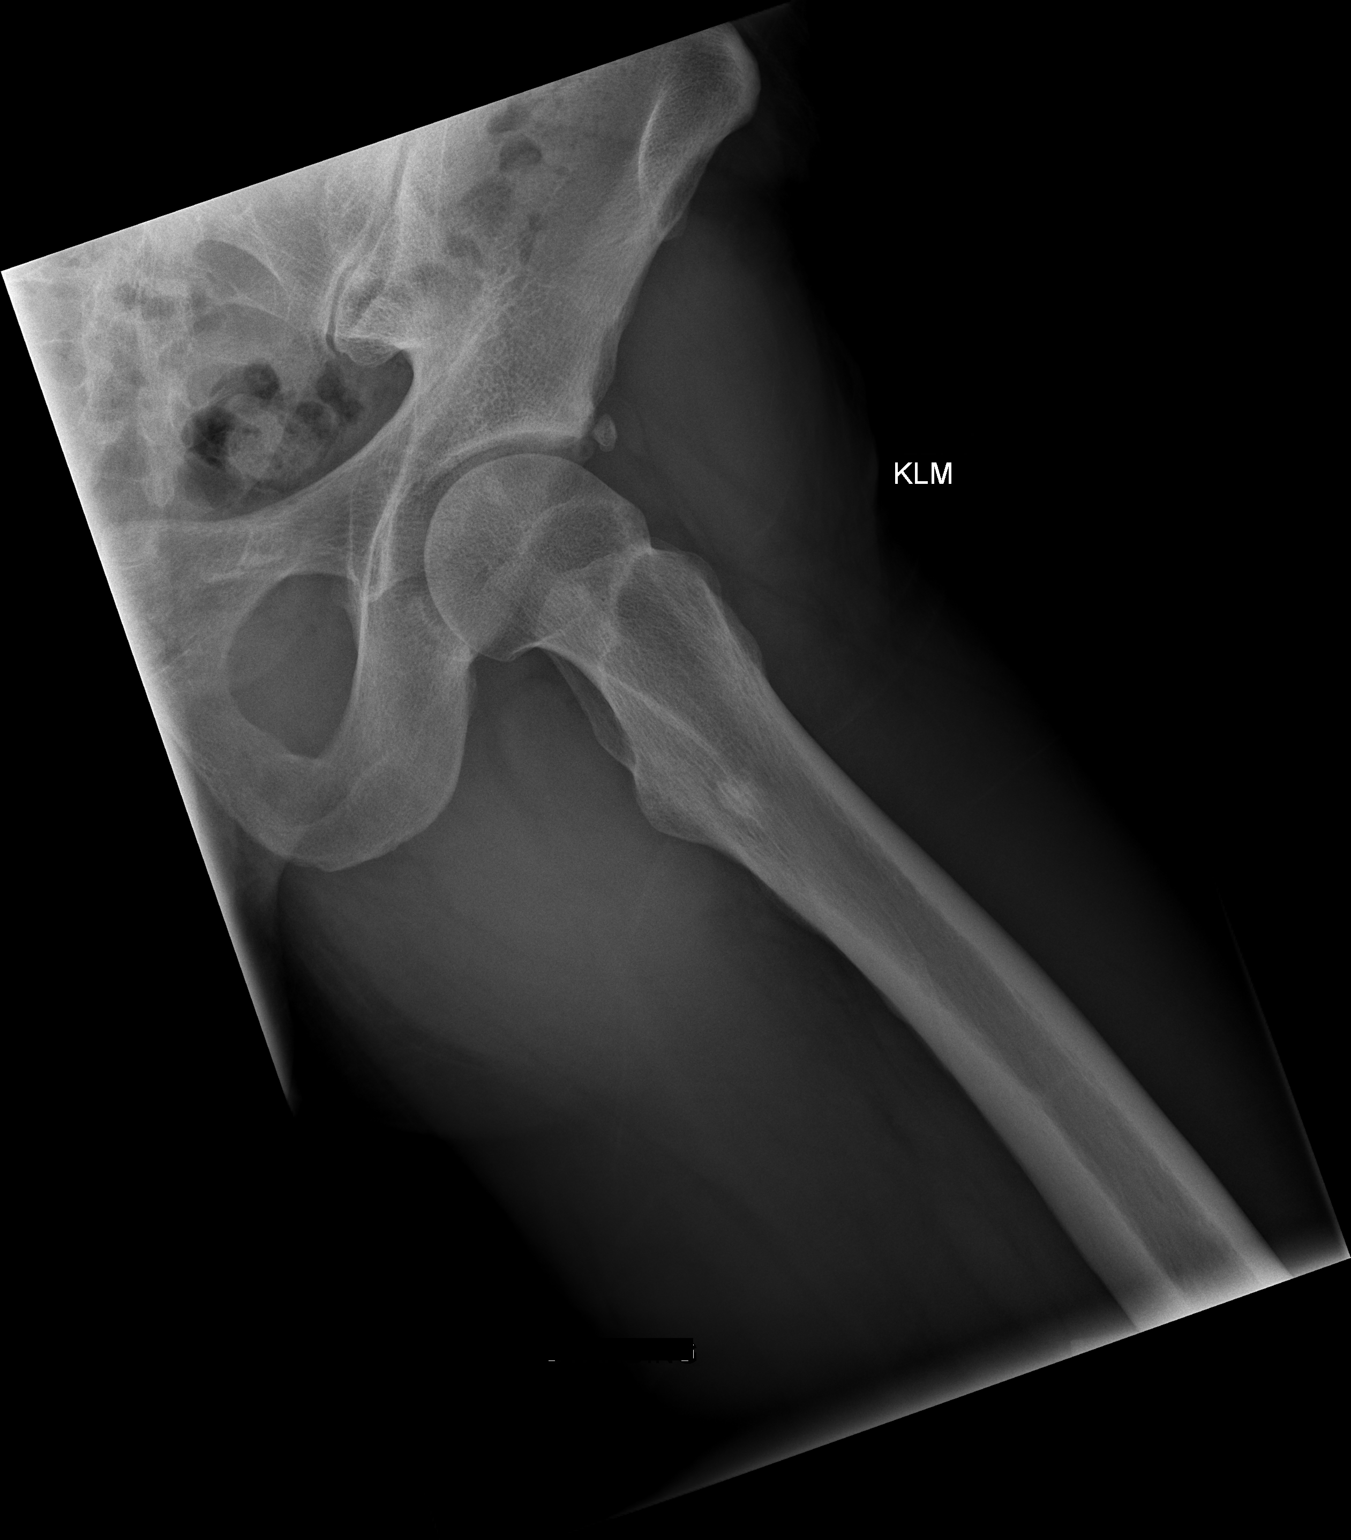

[2 of 2 positions shown; findings below may reference images not displayed]

FINDINGS: AP and frog-leg lateral views of the left hip. The mineralization
and alignment are normal. There is no evidence of acute fracture,
dislocation or femoral head avascular necrosis. There is a small os
acetabuli. There is mild lateral convexity of the humeral neck on
the frog-leg lateral view suggesting underlying CAM type
femoroacetabular impingement (pistol grip deformity). Small bone
island noted in the intertrochanteric region.
IMPRESSION: 1. No acute osseous findings or evidence of avascular necrosis.
2. Possible CAM type femoroacetabular impingement.

## 2021-01-22 ENCOUNTER — Other Ambulatory Visit: Payer: Self-pay | Admitting: Family Medicine

## 2021-03-29 DIAGNOSIS — S29011A Strain of muscle and tendon of front wall of thorax, initial encounter: Secondary | ICD-10-CM | POA: Diagnosis not present

## 2021-04-23 DIAGNOSIS — S29011A Strain of muscle and tendon of front wall of thorax, initial encounter: Secondary | ICD-10-CM | POA: Diagnosis not present

## 2022-01-31 ENCOUNTER — Other Ambulatory Visit: Payer: Self-pay | Admitting: Family Medicine

## 2022-09-13 ENCOUNTER — Telehealth: Payer: Self-pay | Admitting: Family Medicine

## 2022-09-13 ENCOUNTER — Other Ambulatory Visit: Payer: Self-pay | Admitting: Family Medicine

## 2022-09-13 MED ORDER — ESCITALOPRAM OXALATE 10 MG PO TABS: 10.0000 mg | ORAL_TABLET | Freq: Every day | ORAL | 3 refills | Status: DC

## 2022-09-13 NOTE — Telephone Encounter (Signed)
Prescription Request  09/13/2022  LOV: Visit date not found  What is the name of the medication or equipment?   escitalopram (LEXAPRO) 10 MG tablet   Have you contacted your pharmacy to request a refill? Yes   Which pharmacy would you like this sent to?  [id number] - [place], [place] - [address] [place] [address] Phone: [phone] Fax: [phone]    Patient notified that their request is being sent to the clinical staff for review and that they should receive a response within 2 business days.   Please advise patient at [phone].

## 2022-09-14 NOTE — Telephone Encounter (Signed)
Appointment to reestablish care scheduled for 09/16/22. Nothing further needed at this time.

## 2022-09-16 ENCOUNTER — Encounter: Payer: Self-pay | Admitting: Family Medicine

## 2022-09-16 ENCOUNTER — Ambulatory Visit: Payer: BC Managed Care – PPO | Admitting: Family Medicine

## 2022-09-16 VITALS — BP 114/72 | HR 64 | Temp 98.0°F | Ht 72.0 in | Wt 230.0 lb

## 2022-09-16 DIAGNOSIS — Z13220 Encounter for screening for lipoid disorders: Secondary | ICD-10-CM

## 2022-09-16 DIAGNOSIS — R5383 Other fatigue: Secondary | ICD-10-CM | POA: Diagnosis not present

## 2022-09-16 NOTE — Progress Notes (Signed)
Subjective:    Patient ID: Samuel Cunningham, male    DOB: Jan 10, 1991, 32 y.o.   MRN: [id number]  HPI Patient has a history of generalized anxiety disorder and has been taking Lexapro for many years.  He states that he is doing very well on it.  He denies any depression.  He denies any anhedonia.  He denies any suicidal thoughts.  He would like to continue the medication because his life is very busy right now he is afraid to try to wean off of it.  He does report fatigue.  He denies any erectile problems.  He denies any poor libido.  He would like to check a testosterone level and his thyroid.  He is also due for basic lab work Past Medical History:  Diagnosis Date   Allergy    Anxiety    Past Surgical History:  Procedure Laterality Date   REPAIR ANKLE LIGAMENT     WISDOM TOOTH EXTRACTION     Current Outpatient Medications on File Prior to Visit  Medication Sig Dispense Refill   escitalopram (LEXAPRO) 10 MG tablet Take 1 tablet (10 mg total) by mouth daily. 90 tablet 3   No current facility-administered medications on file prior to visit.   Allergies  Allergen Reactions   Codeine Itching and Nausea Only   Social History   Socioeconomic History   Marital status: Single    Spouse name: Not on file   Number of children: Not on file   Years of education: Not on file   Highest education level: Not on file  Occupational History   Not on file  Tobacco Use   Smoking status: Former    Current packs/day: 0.00    Average packs/day: 0.5 packs/day for 10.0 years (5.0 ttl pk-yrs)    Types: Cigarettes    Start date: 02/14/2006    Quit date: 02/15/2016    Years since quitting: 6.5   Smokeless tobacco: Current    Types: Chew  Vaping Use   Vaping status: Never Used  Substance and Sexual Activity   Alcohol use: Yes   Drug use: No   Sexual activity: Not on file  Other Topics Concern   Not on file  Social History Narrative   Not on file   Social Determinants of Health   Financial  Resource Strain: Not on file  Food Insecurity: Not on file  Transportation Needs: Not on file  Physical Activity: Not on file  Stress: Not on file  Social Connections: Not on file  Intimate Partner Violence: Not on file         Review of Systems  All other systems reviewed and are negative.      Objective:   Physical Exam Vitals reviewed.  Constitutional:      General: He is not in acute distress.    Appearance: He is well-developed. He is not diaphoretic.  Cardiovascular:     Rate and Rhythm: Normal rate and regular rhythm.     Heart sounds: Normal heart sounds.  Pulmonary:     Effort: Pulmonary effort is normal.     Breath sounds: Normal breath sounds.  Neurological:     Deep Tendon Reflexes: Reflexes normal.  Psychiatric:        Behavior: Behavior normal.        Thought Content: Thought content normal.        Judgment: Judgment normal.           Assessment & Plan:  Screening cholesterol  level - Plan: CBC with Differential/Platelet, COMPLETE METABOLIC PANEL WITH GFR, Lipid panel  Fatigue, unspecified type - Plan: Testosterone Total,Free,Bio, Males, TSH  Patient was tolerating Lexapro well without any side effects. He states that his quality of life was very good on the medication.  We will continue Lexapro 10 mg p.o. daily as long as the patient desires.  I see no contraindication.  I will check a CBC CMP and a fasting lipid panel to screen for high cholesterol.  Due to his fatigue I will check a TSH and a testosterone level.  We discussed the risk of testosterone replacement.

## 2022-09-23 ENCOUNTER — Other Ambulatory Visit: Payer: BC Managed Care – PPO

## 2022-11-30 DIAGNOSIS — M5126 Other intervertebral disc displacement, lumbar region: Secondary | ICD-10-CM | POA: Diagnosis not present

## 2022-11-30 DIAGNOSIS — M47816 Spondylosis without myelopathy or radiculopathy, lumbar region: Secondary | ICD-10-CM | POA: Diagnosis not present

## 2022-11-30 DIAGNOSIS — M5136 Other intervertebral disc degeneration, lumbar region with discogenic back pain only: Secondary | ICD-10-CM | POA: Diagnosis not present

## 2022-11-30 DIAGNOSIS — M791 Myalgia, unspecified site: Secondary | ICD-10-CM | POA: Diagnosis not present

## 2022-11-30 DIAGNOSIS — M48061 Spinal stenosis, lumbar region without neurogenic claudication: Secondary | ICD-10-CM | POA: Diagnosis not present

## 2023-01-09 ENCOUNTER — Telehealth (HOSPITAL_COMMUNITY): Payer: Self-pay | Admitting: Licensed Clinical Social Worker

## 2023-01-09 NOTE — Telephone Encounter (Signed)
The therapist returns Samuel Cunningham call confirming his identity via 2 identifiers.  He says that he is interested in coming for individual therapy to learn self-control in relation to alcohol and eating.  He says that he quit drinking the Friday before last because an episode occurred that would not otherwise have occurred if he were not drinking.  He says that he is met with Mr. Maggie Font for some individual sessions in the past.  The therapist schedules Samuel Cunningham to come in for a CCA on December 5 at 3 PM with Casimiro Needle instructed to come in at 2:30 PM in order to complete new patient paperwork.  He is given this therapist's direct contact number should he need to cancel or change this appointment prior to the 5th.  Myrna Blazer, MA, LCSW, [facility], LCAS 01/09/2023

## 2023-01-19 ENCOUNTER — Encounter (HOSPITAL_COMMUNITY): Payer: Self-pay

## 2023-01-19 ENCOUNTER — Ambulatory Visit (INDEPENDENT_AMBULATORY_CARE_PROVIDER_SITE_OTHER): Payer: BC Managed Care – PPO | Admitting: Licensed Clinical Social Worker

## 2023-01-19 DIAGNOSIS — F39 Unspecified mood [affective] disorder: Secondary | ICD-10-CM

## 2023-01-19 DIAGNOSIS — F1020 Alcohol dependence, uncomplicated: Secondary | ICD-10-CM

## 2023-01-19 NOTE — Progress Notes (Signed)
Comprehensive Clinical Assessment (CCA) Note  01/19/2023 Samuel Cunningham [id number]  Chief Complaint: No chief complaint on file.  Visit Diagnosis: Alcohol Use Disorder, Severe and Unspecified Affective Disorder    CCA Screening, Triage and Referral (STR)  Patient Reported Information How did you hear about [place]? Other (Comment)  Referral name: [employer]  Referral phone number: No data recorded  Whom do you see for routine medical problems? Primary Care  Practice/Facility Name: [facility]  Practice/Facility Phone Number: No data recorded Name of Contact: Dr. Truddie Crumble  Contact Number: No data recorded Contact Fax Number: No data recorded Prescriber Name: No data recorded Prescriber Address (if known): No data recorded  What Is the Reason for Your Visit/Call Today? drank heavily on weekends and in college and got a DWI and got kicked out of college; it got to the point of drinking every day and hiding it from his wife; he says that he does like drinking but would like to find a happy median  How Long Has This Been Causing You Problems? > than 6 months  What Do You Feel Would Help You the Most Today? Alcohol or Drug Use Treatment   Have You Recently Been in Any Inpatient Treatment (Hospital/Detox/Crisis Center/28-Day Program)? No  Name/Location of Program/Hospital:No data recorded How Long Were You There? No data recorded When Were You Discharged? No data recorded  Have You Ever Received Services From [facility] Before? Yes  Who Do You See at [facility]? No data recorded  Have You Recently Had Any Thoughts About Hurting Yourself? No  Are You Planning to Commit Suicide/Harm Yourself At This time? No   Have you Recently Had Thoughts About Hurting Someone Samuel Cunningham? No  Explanation: No data recorded  Have You Used Any Alcohol or Drugs in the Past 24 Hours? No  How Long Ago Did You Use Drugs or Alcohol? No data recorded What Did You Use and  How Much? No data recorded  Do You Currently Have a Therapist/Psychiatrist? No  Name of Therapist/Psychiatrist: No data recorded  Have You Been Recently Discharged From Any Office Practice or Programs? No  Explanation of Discharge From Practice/Program: No data recorded    CCA Screening Triage Referral Assessment Type of Contact: Face-to-Face  Is this Initial or Reassessment? No data recorded Date Telepsych consult ordered in [facility]:  No data recorded Time Telepsych consult ordered in [facility]:  No data recorded  Patient Reported Information Reviewed? No data recorded Patient Left Without Being Seen? No data recorded Reason for Not Completing Assessment: No data recorded  Collateral Involvement: No data recorded  Does Patient Have a Court Appointed Legal Guardian? No data recorded Name and Contact of Legal Guardian: No data recorded If Minor and Not Living with Parent(s), Who has Custody? No data recorded Is CPS involved or ever been involved? Never  Is APS involved or ever been involved? Never   Patient Determined To Be At Risk for Harm To Self or Others Based on Review of Patient Reported Information or Presenting Complaint? No  Method: No Plan  Availability of Means: No data recorded Intent: No data recorded Notification Required: No data recorded Additional Information for Danger to Others Potential: No data recorded Additional Comments for Danger to Others Potential: No data recorded Are There Guns or Other Weapons in Your Home? Yes  Types of Guns/Weapons: No data recorded Are These Weapons Safely Secured?  Yes  Who Could Verify You Are Able To Have These Secured: No data recorded Do You Have any Outstanding Charges, Pending Court Dates, Parole/Probation? No data recorded Contacted To Inform of Risk of Harm To Self or Others: No data recorded  Location of Assessment: Other (comment) ([address])   Does Patient Present under Involuntary  Commitment? No  IVC Papers Initial File Date: No data recorded  [place] of Residence: [place]   Patient Currently Receiving the Following Services: Medication Management   Determination of Need: Routine (7 days)   Options For Referral: Outpatient Therapy     CCA Biopsychosocial Intake/Chief Complaint:  moderate depression or anxiety; he has not drunk in three weeks having a desire to drink the first week  Current Symptoms/Problems: No data recorded  Patient Reported Schizophrenia/Schizoaffective Diagnosis in Past: No   Strengths: likes to think positive most of the time and help other people and stay active  Preferences: No data recorded Abilities: No data recorded  Type of Services Patient Feels are Needed: No data recorded  Initial Clinical Notes/Concerns: no prior treatment history or AA attendance; does not really want to be on Lexapro   Mental Health Symptoms Depression:   -- (see PHQ-9)   Duration of Depressive symptoms: No data recorded  Mania:   None   Anxiety:    -- (see GAD-7)   Psychosis:   None   Duration of Psychotic symptoms: No data recorded  Trauma:   None   Obsessions:   None   Compulsions:   None   Inattention:   None   Hyperactivity/Impulsivity:   None   Oppositional/Defiant Behaviors:   None   Emotional Irregularity:   None   Other Mood/Personality Symptoms:  No data recorded   Mental Status Exam Appearance and self-care  Stature:   Average (5"11)   Weight:   Average weight (220 lbs)   Clothing:   Casual   Grooming:   Normal   Cosmetic use:   None   Posture/gait:   Normal   Motor activity:   Not Remarkable   Sensorium  Attention:   Normal   Concentration:   Normal   Orientation:   X5   Recall/memory:   Normal   Affect and Mood  Affect:   Appropriate   Mood:   Anxious; Depressed   Relating  Eye contact:   Normal   Facial expression:   Responsive   Attitude toward examiner:    Cooperative   Thought and Language  Speech flow:  Clear and Coherent   Thought content:   Appropriate to Mood and Circumstances   Preoccupation:   None   Hallucinations:   None   Organization:  No data recorded  [employer] of Knowledge:  No data recorded  Intelligence:  No data recorded  Abstraction:   Abstract   Judgement:   Good   Reality Testing:   Adequate   Insight:   Flashes of insight   Decision Making:   Normal   Social Functioning  Social Maturity:   Responsible   Social Judgement:   Normal   Stress  Stressors:   Family conflict; Housing (they sold their house and live in a camper with two kids; they are planning on building a house; he and wife have not been getting along mainly due to the drinking)   Coping Ability:   Normal   Skill Deficits:   None   Supports:   Family (mom, dad, and two cousins  are supports; he has a couple of friends but they drink; he has three non-drinking friends)     Religion: Religion/Spirituality Are You A Religious Person?: Yes (somewhat; "Jesus Christ") How Might This Affect Treatment?: N/A  Leisure/Recreation: Leisure / Recreation Do You Have Hobbies?: Yes Leisure and Hobbies: hunting and goes to gym quite a bit  Exercise/Diet: Exercise/Diet Do You Exercise?: Yes What Type of Exercise Do You Do?: Other (Comment) (goes to gym) How Many Times a Week Do You Exercise?: 1-3 times a week Have You Gained or Lost A Significant Amount of Weight in the Past Six Months?: Yes-Gained Number of Pounds Gained: 20 Do You Follow a Special Diet?: No Do You Have Any Trouble Sleeping?: No   CCA Employment/Education Employment/Work Situation: Employment / Work Situation Employment Situation: Employed 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?: No  Education: Education Is Patient Currently Attending School?: No Last Grade Completed: 14 Name of High School: [employer] [place] Did Garment/textile technologist From [employer]?: Yes Did Theme park manager?: Yes What Type of College Degree Do you Have?: did not graduate; was studying turf grass management Did You Attend Graduate School?: No Did You Have An Individualized Education Program (IIEP): No Did You Have Any Difficulty At School?: No (made C's but did not really care much for school) Patient's Education Has Been Impacted by Current Illness: No   CCA Family/Childhood History Family and Relationship History: Family history Marital status: Married Number of Years Married: 4 What types of issues is patient dealing with in the relationship?: drinking is a problem for his wife; he says that she sometimes gets so wrapped up in her work that it seems that his opinion does not matter Are you sexually active?: Yes What is your sexual orientation?: Hetersexual Has your sexual activity been affected by drugs, alcohol, medication, or emotional stress?: decreased Does patient have children?: Yes How many children?: 2 How is patient's relationship with their children?: has children ages three-and-a-half and a year-and-a-half  Childhood History:  Childhood History By whom was/is the patient raised?: Both parents Additional childhood history information: grew up in [place] raised by both parents; childhood was "kind of strict;" he did not have a babysitter so could do what he wanted in the 1st grade when his parents were not around; his maternal grandfather, uncle, and and aunt have alcohol problems; his 1st  cousin on mom's side overdosed on heroin; he does not know about his dad's side Description of patient's relationship with caregiver when they were a child: decent Patient's description of current relationship with people who raised him/her: great How were you disciplined when you got in trouble as a child/adolescent?: whoopings and groundings Does patient have siblings?: Yes Number of Siblings: 1 Description of patient's current relationship with siblings: one brother 19 years his senior and their relationship is "great" Did patient suffer any verbal/emotional/physical/sexual abuse as a child?: No Did patient suffer from severe childhood neglect?: No Has patient ever been sexually abused/assaulted/raped as an adolescent or adult?: No Was the patient ever a victim  of a crime or a disaster?: No Witnessed domestic violence?: No Has patient been affected by domestic violence as an adult?: No  Child/Adolescent Assessment:     CCA Substance Use Alcohol/Drug Use: Alcohol / Drug Use Pain Medications: no pain medications Prescriptions: Lexapro 10 mg for years for anxiety and worrying but was "drinking kind of heavy then Over the Counter: None History of alcohol / drug use?: Yes Longest period of sobriety (when/how long): 6 months back in 2019 as he got into an argument with his wife about it and she was pregnant Negative Consequences of Use: Personal relationships, Legal Withdrawal Symptoms: Blackouts Substance #1 Name of Substance 1: Alcohol 1 - Age of First Use: 12 1 - Amount (size/oz): 6-7 beers on weekdays and weekends was drinking 19-20 beers per night 1 - Frequency: daily 1 - Duration: daily for a year; before that was drinking on weekends occasionally 1 - Last Use / Amount: three weeks ago 1 - Method of Aquiring: legal 1- Route of Use: oral Substance #2 Name of Substance 2: Zen nicotine dip 2 - Age of First Use: 8 2 - Amount (size/oz): one pinch 2 - Frequency: daily 2 -  Duration: year 2 - Last Use / Amount: today/one pinch 2 - Method of Aquiring: legal 2 - Route of Substance Use: oral                     ASAM's:  Six Dimensions of Multidimensional Assessment  Dimension 1:  Acute Intoxication and/or Withdrawal Potential:   Dimension 1:  Description of individual's past and current experiences of substance use and withdrawal: denies withdrawal  Dimension 2:  Biomedical Conditions and Complications:   Dimension 2:  Description of patient's biomedical conditions and  complications: no health problems  Dimension 3:  Emotional, Behavioral, or Cognitive Conditions and Complications:  Dimension 3:  Description of emotional, behavioral, or cognitive conditions and complications: moderate depression and anxiety  Dimension 4:  Readiness to Change:  Dimension 4:  Description of Readiness to Change criteria: says that he wants to "drink nothing" for a good while until he can find some "self-control" and then "maybe can have a couple;" he rates his desire as a 10/10 due to the problems it has caused when overdoing it  Dimension 5:  Relapse, Continued use, or Continued Problem Potential:  Dimension 5:  Relapse, continued use, or continued problem potential critiera description: previous longest sobriety was 6 months but says he has been overeating; in 2020, he weighed 190 pounds but now weighs 220 lbls  Dimension 6:  Recovery/Living Environment:  Dimension 6:  Recovery/Iiving environment criteria description: wife may have a glass of wine once every other day or on the weekends  ASAM Severity Score: ASAM's Severity Rating Score: 6  ASAM Recommended Level of Treatment: ASAM Recommended Level of Treatment: Level I Outpatient Treatment   Substance use Disorder (SUD) Substance Use Disorder (SUD)  Checklist Symptoms of Substance Use: Continued use despite having a persistent/recurrent physical/psychological problem caused/exacerbated by use, Continued use despite  persistent or recurrent social, interpersonal problems, caused or exacerbated by use, Evidence of tolerance, Large amounts of time spent to obtain, use or recover from the substance(s), Persistent desire or unsuccessful efforts to cut down or control use, Presence of craving or strong urge to use, Recurrent use that results in a failure to fulfill major role obligations (work, school, home), Repeated use in physically hazardous situations, Social, occupational, recreational activities given up or reduced due to  use, Substance(s) often taken in larger amounts or over longer times than was intended  Recommendations for Services/Supports/Treatments:    DSM5 Diagnoses: Patient Active Problem List   Diagnosis Date Noted   Current smoker 07/20/2018   Intermittent asthma without complication 06/28/2018   Persistent cough for 3 weeks or longer 05/16/2018   Allergy     Patient Centered Plan: Patient is on the following Treatment Plan(s):  Substance Abuse   Referrals to Alternative Service(s): Referred to Alternative Service(s):   Place:   Date:   Time:    Referred to Alternative Service(s):   Place:   Date:   Time:    Referred to Alternative Service(s):   Place:   Date:   Time:    Referred to Alternative Service(s):   Place:   Date:   Time:      Collaboration of Care: Other N/A  Patient/Guardian was advised Release of Information must be obtained prior to any record release in order to collaborate their care with an outside provider. Patient/Guardian was advised if they have not already done so to contact the registration department to sign all necessary forms in order for [place] to release information regarding their care.   Consent: Patient/Guardian gives verbal consent for treatment and assignment of benefits for services provided during this visit. Patient/Guardian expressed understanding and agreed to proceed.   Plan: The therapist educates Delmon about alcoholism as being a disease and  encourages him to do some virtual AA meetings on-line with his camera and mic off proving him with the [email or web address] website.   Myrna Blazer, MA, LCSW, [facility], LCAS 01/19/2023

## 2023-02-02 ENCOUNTER — Encounter (HOSPITAL_COMMUNITY): Payer: Self-pay | Admitting: Licensed Clinical Social Worker

## 2023-02-02 ENCOUNTER — Ambulatory Visit (HOSPITAL_COMMUNITY): Payer: BC Managed Care – PPO | Admitting: Licensed Clinical Social Worker

## 2023-05-05 ENCOUNTER — Other Ambulatory Visit: Payer: Self-pay | Admitting: Family Medicine

## 2023-05-05 MED ORDER — ESCITALOPRAM OXALATE 10 MG PO TABS: 10.0000 mg | ORAL_TABLET | Freq: Every day | ORAL | 0 refills | Status: DC

## 2023-05-05 NOTE — Telephone Encounter (Signed)
 Medication Refill -  Most Recent Primary Care Visit:  Provider: Lynnea Ferrier T  Department: [facility]  Visit Type: OFFICE VISIT  Date: 09/16/2022  Medication: Lecapro 10 mg  Has the patient contacted their pharmacy? Yes (Agent: If no, request that the patient contact the pharmacy for the refill. If patient does not wish to contact the pharmacy document the reason why and proceed with request.) (Agent: If yes, when and what did the pharmacy advise?)  Is this the correct pharmacy for this prescription? Yes If no, delete pharmacy and type the correct one.  This is the patient's preferred pharmacy:  [id number] - [place], [place] - [address]  [place] [address] Phone: [phone] Fax: [phone]   Has the prescription been filled recently? No  Is the patient out of the medication? Yes  Has the patient been seen for an appointment in the last year OR does the patient have an upcoming appointment? Yes  Can we respond through MyChart? No  Agent: Please be advised that Rx refills may take up to 3 business days. We ask that you follow-up with your pharmacy.

## 2023-05-05 NOTE — Telephone Encounter (Signed)
 Patient called and advised per last OV note he was supposed to have blood work done. He says he's been so busy with work that he just hasn't had the time to schedule. I asked if he wanted me to schedule, he says he will call Dr. Tanya Nones personally and discuss with him. Advised I will send in a 3 mo supply of lexapro, but it's important to have the blood work done before the next refill, he verbalized understanding.

## 2023-05-05 NOTE — Telephone Encounter (Signed)
 Requested Prescriptions  Pending Prescriptions Disp Refills   escitalopram (LEXAPRO) 10 MG tablet 90 tablet 0    Sig: Take 1 tablet (10 mg total) by mouth daily.     Psychiatry:  Antidepressants - SSRI Failed - 05/05/2023  3:35 PM      Failed - Valid encounter within last 6 months    Recent Outpatient Visits           4 years ago Chronic left hip pain   [facility] Tanya Nones, Priscille Heidelberg, MD   4 years ago Wheezing   [facility] Donita Brooks, MD   4 years ago Persistent cough for 3 weeks or longer   [facility] Tanya Nones, Priscille Heidelberg, MD   4 years ago Persistent cough for 3 weeks or longer   [facility] at [facility], Shorter, PA-C   6 years ago GAD (generalized anxiety disorder)   [facility] Pickard, Priscille Heidelberg, MD

## 2023-05-22 ENCOUNTER — Other Ambulatory Visit

## 2023-05-22 DIAGNOSIS — M791 Myalgia, unspecified site: Secondary | ICD-10-CM | POA: Diagnosis not present

## 2023-05-22 DIAGNOSIS — Z13220 Encounter for screening for lipoid disorders: Secondary | ICD-10-CM | POA: Diagnosis not present

## 2023-05-22 DIAGNOSIS — J4520 Mild intermittent asthma, uncomplicated: Secondary | ICD-10-CM

## 2023-05-22 DIAGNOSIS — F17200 Nicotine dependence, unspecified, uncomplicated: Secondary | ICD-10-CM

## 2023-05-22 DIAGNOSIS — R5383 Other fatigue: Secondary | ICD-10-CM

## 2023-05-23 ENCOUNTER — Encounter: Payer: Self-pay | Admitting: Family Medicine

## 2023-05-23 LAB — CBC WITH DIFFERENTIAL/PLATELET
Absolute Lymphocytes: 1817 {cells}/uL (ref 850–3900)
Absolute Monocytes: 449 {cells}/uL (ref 200–950)
Basophils Absolute: 48 {cells}/uL (ref 0–200)
Basophils Relative: 1.1 %
Eosinophils Absolute: 88 {cells}/uL (ref 15–500)
Eosinophils Relative: 2 %
HCT: 41.5 % (ref 38.5–50.0)
Hemoglobin: 13.7 g/dL (ref 13.2–17.1)
MCH: 29.9 pg (ref 27.0–33.0)
MCHC: 33 g/dL (ref 32.0–36.0)
MCV: 90.6 fL (ref 80.0–100.0)
MPV: 10 fL (ref 7.5–12.5)
Monocytes Relative: 10.2 %
Neutro Abs: 1998 {cells}/uL (ref 1500–7800)
Neutrophils Relative %: 45.4 %
Platelets: 240 10*3/uL (ref 140–400)
RBC: 4.58 10*6/uL (ref 4.20–5.80)
RDW: 12.9 % (ref 11.0–15.0)
Total Lymphocyte: 41.3 %
WBC: 4.4 10*3/uL (ref 3.8–10.8)

## 2023-05-23 LAB — TSH: TSH: 1.49 m[IU]/L (ref 0.40–4.50)

## 2023-05-23 LAB — COMPLETE METABOLIC PANEL WITHOUT GFR
AG Ratio: 2.3 (calc) (ref 1.0–2.5)
ALT: 15 U/L (ref 9–46)
AST: 21 U/L (ref 10–40)
Albumin: 4.2 g/dL (ref 3.6–5.1)
Alkaline phosphatase (APISO): 59 U/L (ref 36–130)
BUN: 12 mg/dL (ref 7–25)
CO2: 28 mmol/L (ref 20–32)
Calcium: 8.8 mg/dL (ref 8.6–10.3)
Chloride: 105 mmol/L (ref 98–110)
Creat: 0.89 mg/dL (ref 0.60–1.26)
Globulin: 1.8 g/dL — ABNORMAL LOW (ref 1.9–3.7)
Glucose, Bld: 96 mg/dL (ref 65–99)
Potassium: 4.7 mmol/L (ref 3.5–5.3)
Sodium: 139 mmol/L (ref 135–146)
Total Bilirubin: 0.3 mg/dL (ref 0.2–1.2)
Total Protein: 6 g/dL — ABNORMAL LOW (ref 6.1–8.1)

## 2023-05-23 LAB — LIPID PANEL
Cholesterol: 131 mg/dL (ref <200)
HDL: 45 mg/dL (ref >=40)
LDL Cholesterol (Calc): 69 mg/dL
Non-HDL Cholesterol (Calc): 86 mg/dL (ref <130)
Total CHOL/HDL Ratio: 2.9 (calc) (ref <5.0)
Triglycerides: 88 mg/dL (ref <150)

## 2023-05-23 LAB — TESTOSTERONE: Testosterone: 767 ng/dL (ref 250–827)

## 2023-05-30 ENCOUNTER — Ambulatory Visit (HOSPITAL_COMMUNITY): Payer: Self-pay | Admitting: Licensed Clinical Social Worker

## 2023-05-30 DIAGNOSIS — F39 Unspecified mood [affective] disorder: Secondary | ICD-10-CM

## 2023-05-30 DIAGNOSIS — F1020 Alcohol dependence, uncomplicated: Secondary | ICD-10-CM

## 2023-05-30 DIAGNOSIS — F1021 Alcohol dependence, in remission: Secondary | ICD-10-CM

## 2023-05-30 NOTE — Progress Notes (Signed)
 THERAPIST PROGRESS NOTE  Session Time: 2:01 p.m. to 2:35 p.m.   Virtual Visit via Video Note   I connected with Reed at 2:01 p.m.     EST by a video enabled telemedicine application and verified that I am speaking with the correct person using two identifiers.   Location: Patient: [place], [place] Provider: [facility] office   I discussed the limitations of evaluation and management by telemedicine and the availability of in person appointments. The patient expressed understanding and agreed to proceed.  Type of Therapy: Individual   Therapist Response/Interventions: Solution Focused/The therapist praises Jaeson for his sobriety while cautioning him about avoiding triggers such as bars.  He discusses with Xaine options for working on being more assertive through individual and/or conjoint sessions with his wife suggesting that it would like be easier if his wife were able to attend so as to give her perspective on the problem.   Treatment Goals addressed:  Active     Substance Use     Crescencio will abstain from alcohol on a daily basis while beginning to develop sober supports via things like attending Alcoholics Anonymous per self-report. (Progressing)     Start:  01/19/23    Expected End:  07/20/23         Savier will experience a reduction in his depression and anxiety as evidenced by his PHQ-9 and GAD-7 both being a 4 or less. (Progressing)     Start:  01/19/23    Expected End:  07/20/23         The therapist will assist Oaklan in being able to identify and avoid triggers for drinking and assist him in overcoming any barriers or obstacles towards developing a sober support system.     Start:  01/19/23         The therapist will assist Dellas in the identifying and changing thoughts and behaviors that contribute to his feelings of depression and anxiety.     Start:  01/19/23      Bambi Lever gives verbal permission for this therapist to electronically signed his  care plan on his behalf.         Summary: Kristof presents saying that he has not drunk alcohol since December. He says that he can go to bars with friends and not drink and that it does not bother him; however, he then revises this admitting that it bothers him some.  When asked the reason for his scheduling this appointment today, he says that it relates to an argument that he had with his wife recently which apparently became heated and in which he brought up this issue about her getting so wrapped up in her work. When the therapist asks if she has any complaints concerning Saiquan, he says that she gets extremely upset when he forgets to do things that she asks him to do such as remembering to brush their children's teeth before putting them to bed or taking out the trash.   He says that his wife believes that he has a tendency to hold in his feelings and not express himself to which Travas is in agreement. It sounds like no substantive change came as a result of this argument other than they have agreed to not have such conversations in the future unless they are both calm.  Torri does suggest that his wife does not give him enough physical affection; however, he recognizes this is difficult with two small children while living in a small trailer. He says  that they will hopefully be able to move into their new home in the near future.   He says that his wife has one or two alcoholic drinks approximately every other day but denies that she drinks excessively.   Progress Towards Goals: Progressing  Suicidal/Homicidal: No SI or HI  Plan: Daris is interested in asking his wife if she will attend a session and will call this therapist back after doing so to possibly schedule a conjoint session  Diagnosis: Alcohol Use Disorder, Severe and Unspecified Affective Disorder  Collaboration of Care: Other therapist talks to Coda about inviting his wife to a conjoint session  Patient/Guardian  was advised Release of Information must be obtained prior to any record release in order to collaborate their care with an outside provider. Patient/Guardian was advised if they have not already done so to contact the registration department to sign all necessary forms in order for us  to release information regarding their care.   Consent: Patient/Guardian gives verbal consent for treatment and assignment of benefits for services provided during this visit. Patient/Guardian expressed understanding and agreed to proceed.   Melynda Stagger, MA, LCSW, [facility], LCAS 05/30/2023

## 2023-06-23 ENCOUNTER — Ambulatory Visit: Payer: Self-pay | Admitting: Family Medicine

## 2023-07-12 DIAGNOSIS — Z1331 Encounter for screening for depression: Secondary | ICD-10-CM | POA: Diagnosis not present

## 2023-07-12 DIAGNOSIS — Z3009 Encounter for other general counseling and advice on contraception: Secondary | ICD-10-CM | POA: Diagnosis not present

## 2023-07-14 DIAGNOSIS — M47816 Spondylosis without myelopathy or radiculopathy, lumbar region: Secondary | ICD-10-CM | POA: Insufficient documentation

## 2023-07-14 DIAGNOSIS — M791 Myalgia, unspecified site: Secondary | ICD-10-CM | POA: Insufficient documentation

## 2023-08-22 ENCOUNTER — Other Ambulatory Visit: Payer: Self-pay | Admitting: Family Medicine

## 2023-08-22 NOTE — Telephone Encounter (Unsigned)
 Copied from CRM [id number]. Topic: Clinical - Medication Refill >> Aug 22, 2023  3:59 PM Powell HERO wrote: Medication: escitalopram  (LEXAPRO ) 10 MG tablet   Has the patient contacted their pharmacy? Yes  This is the patient's preferred pharmacy:  [facility] [address]  Is this the correct pharmacy for this prescription? Yes If no, delete pharmacy and type the correct one.   Has the prescription been filled recently? No  Is the patient out of the medication? No, one left  Has the patient been seen for an appointment in the last year OR does the patient have an upcoming appointment? Yes  Can we respond through MyChart? Yes  Agent: Please be advised that Rx refills may take up to 3 business days. We ask that you follow-up with your pharmacy.

## 2023-08-24 ENCOUNTER — Telehealth: Payer: Self-pay

## 2023-08-24 ENCOUNTER — Other Ambulatory Visit: Payer: Self-pay

## 2023-08-24 DIAGNOSIS — Z302 Encounter for sterilization: Secondary | ICD-10-CM | POA: Diagnosis not present

## 2023-08-24 DIAGNOSIS — Z3009 Encounter for other general counseling and advice on contraception: Secondary | ICD-10-CM | POA: Diagnosis not present

## 2023-08-24 MED ORDER — ESCITALOPRAM OXALATE 10 MG PO TABS: 10.0000 mg | ORAL_TABLET | Freq: Every day | ORAL | 0 refills | Status: DC

## 2023-08-24 NOTE — Telephone Encounter (Signed)
 Copied from CRM [id number]. Topic: Clinical - Medication Refill >> Aug 22, 2023  3:59 PM Powell HERO wrote: Medication: escitalopram  (LEXAPRO ) 10 MG tablet   Has the patient contacted their pharmacy? Yes  This is the patient's preferred pharmacy:  [facility] [address]  Is this the correct pharmacy for this prescription? Yes If no, delete pharmacy and type the correct one.   Has the prescription been filled recently? No  Is the patient out of the medication? No, one left  Has the patient been seen for an appointment in the last year OR does the patient have an upcoming appointment? Yes  Can we respond through MyChart? Yes  Agent: Please be advised that Rx refills may take up to 3 business days. We ask that you follow-up with your pharmacy. >> Aug 24, 2023 11:28 AM Gustabo D wrote: Patient is currently out of his medication lexapro  he called about this on August 22, 2023. He's been out for 2 days please call him back with a update.

## 2023-09-08 ENCOUNTER — Ambulatory Visit: Payer: Self-pay | Admitting: Family Medicine

## 2023-09-12 ENCOUNTER — Ambulatory Visit (INDEPENDENT_AMBULATORY_CARE_PROVIDER_SITE_OTHER): Admitting: Licensed Clinical Social Worker

## 2023-09-12 DIAGNOSIS — F39 Unspecified mood [affective] disorder: Secondary | ICD-10-CM | POA: Diagnosis not present

## 2023-09-12 DIAGNOSIS — F1020 Alcohol dependence, uncomplicated: Secondary | ICD-10-CM | POA: Diagnosis not present

## 2023-09-12 NOTE — Progress Notes (Signed)
 THERAPIST PROGRESS NOTE  Session Time: 12:56 p.m. to 1:48 p.m.   Virtual Visit via Video Note   I connected with Ladon at 12:56  p.m.EST by a video enabled telemedicine application and verified that I am speaking with the correct person using two identifiers.   Location: Patient: [place], [place] Provider: GEANNIE Cher [facility] office   I discussed the limitations of evaluation and management by telemedicine and the availability of in person appointments. The patient expressed understanding and agreed to proceed.  Type of Therapy: Individual   Therapist Response/Interventions: Solution Focused and Motivation Interviewing/The therapist inquires as to what Chibuike knows about addiction and whether or not he considers himself an alcoholic while explaining the inheritability of addiction and discussing how the diagnosis is made. The therapist explains that [place]  So[facility] is not a good plan for developing long-term sobriety from alcohol. He discusses Kaveon's ambivalence about stopping.  The therapist talks to Elo about sober support he can find in AA encouraging him to try a virtual meeting informing him of an app in which he can find meetings 24/7. The therapist offers to meet with [name] and his wife to answer any questions she might have about his condition.   Treatment Goals addressed:  Active     Substance Use     Masud will abstain from alcohol on a daily basis while beginning to develop sober supports via things like attending Alcoholics Anonymous per self-report. (Not Progressing)     Start:  01/19/23    Expected End:  07/20/23         Curry will experience a reduction in his depression and anxiety as evidenced by his PHQ-9 and GAD-7 both being a 4 or less. (Not Applicable)     Start:  01/19/23    Expected End:  07/20/23    Resolved:  09/12/23      The therapist will assist Timouthy in being able to identify and avoid triggers for drinking and assist him in overcoming any  barriers or obstacles towards developing a sober support system.     Start:  01/19/23         The therapist will assist Sian in the identifying and changing thoughts and behaviors that contribute to his feelings of depression and anxiety.     Start:  01/19/23      [name] gives verbal permission for this therapist to electronically signed his care plan on his behalf.               Summary: Sargent presents saying that he started drinking again. 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 His wife says that she was tired of the lying about the drinking and has one foot in the door and one foot out of the door. His wife has her own therapist. He says that he used to have ADHD being diagnosed around age 59 taking Adderall for a while. He continues to take Lexapro .  He resumed drinking after going to a concert a couple of months ago and says, I take it too far when he does drink. When asked if he considers himself to be an alcohol, he says probably so. He wants to be done with drinking but admits ambivalence about stopping due to having fun with it at times.  Jaishon talks about having drunk a weed drink and suggests using marijuana as a means of not drinking  with the therapist explaining the research and experience demonstrating that this is an ill-advised course of action.  When the therapist talks about the need to avoid people, places, and things; Lynwood admits that it would be hard to do as he has family and friends he has known his entire life who are actively in addiction.   By the end of the session, he says that he is agreeable to trying at least on tele-AA meeting between now and when he returns. He does say that in spite of what his wife told him that his mood is better especially as he has not drunk in about 2 weeks.      Progress Towards Goals: Not progressing  Suicidal/Homicidal: No  SI or HI  Plan: Dex will return in 2 weeks and is to attend at least one AA meeting to check it out.   Diagnosis: Alcohol Use Disorder, Severe and Unspecified Affective Disorder  Collaboration of Care:   Patient/Guardian was advised Release of Information must be obtained prior to any record release in order to collaborate their care with an outside provider. Patient/Guardian was advised if they have not already done so to contact the registration department to sign all necessary forms in order for us  to release information regarding their care.   Consent: Patient/Guardian gives verbal consent for treatment and assignment of benefits for services provided during this visit. Patient/Guardian expressed understanding and agreed to proceed.   [name], MA, LCSW, [facility], LCAS 09/12/2023

## 2023-09-21 ENCOUNTER — Other Ambulatory Visit: Payer: Self-pay | Admitting: Family Medicine

## 2023-09-26 ENCOUNTER — Other Ambulatory Visit: Payer: Self-pay | Admitting: Family Medicine

## 2023-09-26 ENCOUNTER — Ambulatory Visit (HOSPITAL_COMMUNITY): Admitting: Licensed Clinical Social Worker

## 2023-09-26 DIAGNOSIS — F1020 Alcohol dependence, uncomplicated: Secondary | ICD-10-CM | POA: Diagnosis not present

## 2023-09-26 DIAGNOSIS — F39 Unspecified mood [affective] disorder: Secondary | ICD-10-CM

## 2023-09-26 NOTE — Progress Notes (Signed)
 THERAPIST PROGRESS NOTE  Session Time: 1:01 p.m. to 2:03 p.m.   Virtual Visit via Video Note   I connected with Samuel Cunningham at 1:01  p.m.EST by a video enabled telemedicine application and verified that I am speaking with the correct person using two identifiers.   Location: Patient: Samuel Cunningham, Samuel Cunningham Provider: GEANNIE Cher [facility] office   I discussed the limitations of evaluation and management by telemedicine and the availability of in person appointments. The patient expressed understanding and agreed to proceed.  Type of Therapy: Individual   Therapist Response/Interventions: CBT and Motivational Interviewing/The therapist inquires as to how Samuel Cunningham's efforts at controlled drinking have worked in the past and observes that Samuel Cunningham is starting to recognize that the short-term benefits of alcohol do not outweigh the long-term problems.  The therapist talks to Samuel Cunningham about the need to be smart versus strong in relation to triggers.   Treatment Goals addressed:  Active     Substance Use     Samuel Cunningham will abstain from alcohol on a daily basis while beginning to develop sober supports via things like attending Alcoholics Anonymous per self-report. (Not Progressing)     Start:  01/19/23    Expected End:  07/20/23         Samuel Cunningham will experience a reduction in his depression and anxiety as evidenced by his PHQ-9 and GAD-7 both being a 4 or less. (Not Applicable)     Start:  01/19/23    Expected End:  07/20/23    Resolved:  09/12/23      The therapist will assist Samuel Cunningham in being able to identify and avoid triggers for drinking and assist him in overcoming any barriers or obstacles towards developing a sober support system.     Start:  01/19/23         The therapist will assist Samuel Cunningham in the identifying and changing thoughts and behaviors that contribute to his feelings of depression and anxiety.     Start:  01/19/23      Samuel Cunningham gives verbal permission for this therapist to electronically  signed his care plan on his behalf.                  Summary: Samuel Cunningham presents saying that they traded in their camper and got a new camper. He says that he has not drunk alcohol or used a weed drink.   He says that he and his wife are starting to do coping mechanisms to get along better. His wife was going to try and get on this appointment today but could not. Samuel Cunningham went to a virtual AA meeting on the app that the therapist gave him. He also sat in on another meeting with his wife concerning living with an alcoholic. He says that he felt kind of weird being in the meeting.   The therapist explores Samuel Cunningham's reaction to his AA meeting and the [name] meeting that he attended. Samuel Cunningham admits that the negatives of drinking are overcoming the positives of drinking. He says, I don't want my kids saying I'm a drunk. He has had some mild cravings to drink once of twice in the weekend or after a hard day at work.  He says that his dad will say he's not drinking and then smell like a beer. All of his friends drink 3 or 4 drinks on a weekday or 10 or 12 at a party. He says that he does have two sober friends with whom he hunts, fishes, and goes out to eat.  Sometimes they will go out to the gym together.   He says that his mood has been pretty decent' and that he is sleeping alright but sometimes wakes up in the middle of the night. When asked how he will deal with parties, he says that he will probably go but get some non-alcoholic beers instead.  Samuel Cunningham says that he does plan on attending one meeting at a time. He asks this therapist if he were to end up being sober for a year if he would be able to have an alcoholic beverage on a holiday or special occasion with the therapist exploring how this has worked for him in the past. Samuel Cunningham concludes that attempting to having one drink has always led to increased drinking and problems.   Progress Towards Goals: Progressing  Suicidal/Homicidal:  No SI or HI  Plan: Samuel Cunningham will return in 2 weeks; he plans on having his wife attend his next visit.   Diagnosis: Alcohol Use Disorder, Severe and Unspecified Affective Disorder  Collaboration of Care: wife to attend next appointment  Patient/Guardian was advised Release of Information must be obtained prior to any record release in order to collaborate their care with an outside provider. Patient/Guardian was advised if they have not already done so to contact the registration department to sign all necessary forms in order for us  to release information regarding their care.   Consent: Patient/Guardian gives verbal consent for treatment and assignment of benefits for services provided during this visit. Patient/Guardian expressed understanding and agreed to proceed.   Samuel Maier, MA, LCSW, [facility], LCAS 09/26/2023

## 2023-09-26 NOTE — Telephone Encounter (Unsigned)
 Copied from CRM #[id number]. Topic: Clinical - Medication Refill >> Sep 26, 2023  2:21 PM Fonda T wrote: Medication: escitalopram  (LEXAPRO ) 10 MG tablet  90 day supply requested  Has the patient contacted their pharmacy? Yes, advised to contact office.    This is the patient's preferred pharmacy:  [id number] - [place], [place] - [address] [place] [address] Phone: [phone] Fax: [phone]  Is this the correct pharmacy for this prescription? Yes If no, delete pharmacy and type the correct one.   Has the prescription been filled recently? Yes  Is the patient out of the medication? Yes, and per patient is having withdrawals, and needs medication as soon as possible.   Has the patient been seen for an appointment in the last year OR does the patient have an upcoming appointment? No, last seen 09/16/22, patient declined to schedule appointment.  Can we respond through MyChart? Yes  Agent: Please be advised that Rx refills may take up to 3 business days. We ask that you follow-up with your pharmacy.

## 2023-09-27 ENCOUNTER — Telehealth: Payer: Self-pay

## 2023-09-27 ENCOUNTER — Other Ambulatory Visit: Payer: Self-pay

## 2023-09-27 ENCOUNTER — Ambulatory Visit: Payer: Self-pay

## 2023-09-27 DIAGNOSIS — M791 Myalgia, unspecified site: Secondary | ICD-10-CM

## 2023-09-27 DIAGNOSIS — M47816 Spondylosis without myelopathy or radiculopathy, lumbar region: Secondary | ICD-10-CM

## 2023-09-27 MED ORDER — ESCITALOPRAM OXALATE 10 MG PO TABS: 10.0000 mg | ORAL_TABLET | Freq: Every day | ORAL | 0 refills | Status: DC

## 2023-09-27 NOTE — Telephone Encounter (Signed)
Pt called to report that he is completely out of his current supply 

## 2023-09-27 NOTE — Telephone Encounter (Signed)
 Copied from CRM #[id number]. Topic: Clinical - Prescription Issue >> Sep 27, 2023  1:19 PM Samuel Cunningham wrote: Reason for CRM: Pt is wanting a call back about his medication escitalopram  (LEXAPRO ) 10 MG tablet, states he has called in 3 times for it. I informed pt that he needs an office visit for it to be refilled and pt stated  I can't come in this week for an appointment and I need it today

## 2023-09-27 NOTE — Telephone Encounter (Signed)
 FYI Only or Action Required?: Action required by provider: medication refill request.  Patient was last seen in primary care on 09/16/2022 by Duanne Butler DASEN, MD.  Called Nurse Triage reporting Dizziness and Nausea.  Symptoms began couple days.  Interventions attempted: Other: patient states he has requested refills twice.  Symptoms are: nausea, lightheaded/dizzy gradually worsening.  Triage Disposition: Call PCP Now  Patient/caregiver understands and will follow disposition?: Yes                   Copied from CRM [id number]. Topic: Clinical - Red Word Triage >> Sep 27, 2023  3:06 PM Montie POUR wrote: Red Word that prompted transfer to Nurse Triage:  Dizzy, nauseated, light headedness  He is out of escitalopram  (LEXAPRO ) 10 MG tablet for last 3 days. He is telling me he cannot get off work to come in for an appointment. I offered a video appointment and he started telling me about withdraw symptoms Reason for Disposition  [1] Prescription refill request for ESSENTIAL medicine (i.e., likelihood of harm to patient if not taken) AND [2] triager unable to refill per department policy  Answer Assessment - Initial Assessment Questions 1. REASON FOR CALL or QUESTION: What is your reason for calling today? or How can I best     Patient has made attempts on 09/21/23 and yesterday 8/12 to have his Lexapro  refilled. He states he has been out 3 days. He states he can not come in for an appointment due to to his work schedule. He is unsure when he can make an appointment to see his PCP. Explained that his last OV was over a year ago and patient became upset and states that's stupid. Tell Dr Duanne if he won't refill it I'll find a new PCP. Patient states he has been having dizziness/lightheaded and nausea constant for 2 days. When attempting to assess patient. He won't answer questions. He states he doesn't understand why he can't just get a refill for 10 days and do a virtual visit.  Patient is upset and states the clinic has not been contacting him or keeping him up to date. He states he was not aware he needed to make an appointment because the clinic did not call him and tell him. He continues to ask for the refill to be completed today. Informed him there is a 3 day turn around on refill requests but that  message will be sent to Dr Duanne.  2. CALLER: Document the source of call. (e.g., laboratory staff, caregiver or patient).     Patient  Protocols used: PCP Call - No Triage-A-AH, Medication Refill and Renewal Call-A-AH

## 2023-09-27 NOTE — Telephone Encounter (Signed)
 Copied from CRM #[id number]. Topic: Clinical - Prescription Issue >> Sep 27, 2023  1:19 PM Sasha H wrote: Reason for CRM: Pt is wanting a call back about his medication escitalopram  (LEXAPRO ) 10 MG tablet, states he has called in 3 times for it. I informed pt that he needs an office visit for it to be refilled and pt stated  I can't come in this week for an appointment and I need it today

## 2023-09-27 NOTE — Telephone Encounter (Signed)
 Copied from CRM [id number]. Topic: General - Other >> Sep 27, 2023  3:39 PM Fonda T wrote: Reason for CRM: Received call from patient wife, Lauraine, states she is calling to follow up on previous medication request of escitalopram  (LEXAPRO ) 10 MG tablet.  Per chart review, Colleen sent message to patient. Informed spouse.  Requesting a follow up call from [facility] to discuss further, please call back at [phone].

## 2023-09-28 ENCOUNTER — Other Ambulatory Visit: Payer: Self-pay | Admitting: Family Medicine

## 2023-09-28 DIAGNOSIS — M47816 Spondylosis without myelopathy or radiculopathy, lumbar region: Secondary | ICD-10-CM

## 2023-09-28 DIAGNOSIS — M791 Myalgia, unspecified site: Secondary | ICD-10-CM

## 2023-09-28 MED ORDER — ESCITALOPRAM OXALATE 10 MG PO TABS: 10.0000 mg | ORAL_TABLET | Freq: Every day | ORAL | 3 refills | Status: DC

## 2023-09-28 NOTE — Telephone Encounter (Signed)
 MyChart msg sent informing pt that Dr Duanne sent in refills. Pt has an appt made for 8/18.

## 2023-09-29 NOTE — Telephone Encounter (Signed)
 Requested by interface surescripts. Receipt confirmed by pharmacy 09/28/23 at 6:37 am. Patient needs appt for further refills. Requested Prescriptions  Refused Prescriptions Disp Refills   escitalopram  (LEXAPRO ) 10 MG tablet 30 tablet 0    Sig: Take 1 tablet (10 mg total) by mouth daily.     Psychiatry:  Antidepressants - SSRI Failed - 09/29/2023 11:19 AM      Failed - Valid encounter within last 6 months    Recent Outpatient Visits           1 year ago Screening cholesterol level   [facility] Duanne, Butler DASEN, MD   5 years ago Persistent cough for 3 weeks or longer   [facility] Primary [facility] & [facility] Medicine at [facility], [place], PA-C

## 2023-10-02 ENCOUNTER — Telehealth (INDEPENDENT_AMBULATORY_CARE_PROVIDER_SITE_OTHER): Payer: Self-pay | Admitting: Family Medicine

## 2023-10-02 DIAGNOSIS — F411 Generalized anxiety disorder: Secondary | ICD-10-CM

## 2023-10-02 MED ORDER — ESCITALOPRAM OXALATE 5 MG PO TABS: 5.0000 mg | ORAL_TABLET | Freq: Every day | ORAL | 3 refills | Status: DC

## 2023-10-02 MED ORDER — BUPROPION HCL ER (XL) 150 MG PO TB24: 150.0000 mg | ORAL_TABLET | Freq: Every day | ORAL | 5 refills | Status: DC

## 2023-10-02 NOTE — Progress Notes (Signed)
 Subjective:    Patient ID: Samuel Cunningham, male    DOB: 15-Jun-1990, 33 y.o.   MRN: [id number]  HPI Patient is being seen today as a video visit.  He is currently at work.  I am currently in my office.  He consents to be seen via video.  Video began at 1104.  Video concluded at 1113.  He has a history of generalized anxiety disorder.  He is currently on Lexapro  10 mg a day.  He has done well on the Lexapro  but he does not like the weight gain he is experience with it.  He estimates that he gained about 30 pounds on medication.  He states that he had a lot of lifestyle changes and is doing better and he would be interested in trying to wean off the Lexapro  and switch to something different that does not cause the weight gain.  He is interested in trying Wellbutrin .  He has no history of seizures. Past Medical History:  Diagnosis Date   Allergy     Anxiety    Past Surgical History:  Procedure Laterality Date   REPAIR ANKLE LIGAMENT     WISDOM TOOTH EXTRACTION     No current outpatient medications on file prior to visit.   No current facility-administered medications on file prior to visit.   Allergies  Allergen Reactions   Codeine Itching and Nausea Only   Social History   Socioeconomic History   Marital status: Single    Spouse name: Not on file   Number of children: Not on file   Years of education: Not on file   Highest education level: Not on file  Occupational History   Not on file  Tobacco Use   Smoking status: Former    Current packs/day: 0.00    Average packs/day: 0.5 packs/day for 10.0 years (5.0 ttl pk-yrs)    Types: Cigarettes    Start date: 02/14/2006    Quit date: 02/15/2016    Years since quitting: 7.6   Smokeless tobacco: Current    Types: Chew  Vaping Use   Vaping status: Never Used  Substance and Sexual Activity   Alcohol use: Yes   Drug use: No   Sexual activity: Not on file  Other Topics Concern   Not on file  Social History Narrative   Not on file    Social Drivers of Health   Financial Resource Strain: Low Risk  (07/11/2023)   Received from [facility]   Overall Financial Resource Strain (CARDIA)    Difficulty of Paying Living Expenses: Not hard at all  Food Insecurity: No Food Insecurity (07/11/2023)   Received from [facility]   Hunger Vital Sign    Within the past 12 months, you worried that your food would run out before you got the money to buy more.: Never true    Within the past 12 months, the food you bought just didn't last and you didn't have money to get more.: Never true  Transportation Needs: No Transportation Needs (07/11/2023)   Received from [facility] - Transportation    Lack of Transportation (Medical): No    Lack of Transportation (Non-Medical): No  Physical Activity: Sufficiently Active (07/11/2023)   Received from [facility]   Exercise Vital Sign    On average, how many days per week do you engage in moderate to strenuous exercise (like a brisk walk)?: 3 days    On average, how many minutes do you engage in  exercise at this level?: 60 min  Stress: No Stress Concern Present (07/11/2023)   Received from [facility] - Occupational Stress Questionnaire    Feeling of Stress : Only a little  Social Connections: Socially Integrated (07/11/2023)   Received from [facility]   Social Network    How would you rate your social network (family, work, friends)?: Good participation with social networks  Intimate Partner Violence: Not At Risk (07/11/2023)   Received from [facility]   HITS    Over the last 12 months how often did your partner physically hurt you?: Never    Over the last 12 months how often did your partner insult you or talk down to you?: Never    Over the last 12 months how often did your partner threaten you with physical harm?: Never    Over the last 12 months how often did your partner scream or curse at you?: Never          Review of Systems  All other systems reviewed and are negative.      Objective:          GAD (generalized anxiety disorder) I explained to the patient the Wellbutrin  has not been studied for anxiety disorder.  However we can start Wellbutrin  extended release 150 mg daily.  Meanwhile we will decrease Lexapro  to 5 mg daily for 2 weeks then 5 mg every other day for 2 weeks then discontinue the medication.  Recheck in 2 to 3 months to see how he is doing.

## 2023-10-06 ENCOUNTER — Other Ambulatory Visit: Payer: Self-pay | Admitting: Family Medicine

## 2023-10-06 DIAGNOSIS — M791 Myalgia, unspecified site: Secondary | ICD-10-CM

## 2023-10-06 DIAGNOSIS — M47816 Spondylosis without myelopathy or radiculopathy, lumbar region: Secondary | ICD-10-CM

## 2023-10-06 NOTE — Telephone Encounter (Signed)
 Requested Prescriptions  Refused Prescriptions Disp Refills   escitalopram  (LEXAPRO ) 10 MG tablet [Pharmacy Med Name: ESCITALOPRAM  10MG  TABLETS] 30 tablet 0    Sig: TAKE 1 TABLET BY MOUTH DAILY     Psychiatry:  Antidepressants - SSRI Passed - 10/06/2023  5:30 PM      Passed - Valid encounter within last 6 months    Recent Outpatient Visits           4 days ago GAD (generalized anxiety disorder)   [facility] Duanne Butler DASEN, MD   1 year ago Screening cholesterol level   [place] [facility] Duanne Butler DASEN, MD   5 years ago Persistent cough for 3 weeks or longer   [facility] Primary Care & Sports Medicine at [facility], [place], PA-C

## 2023-10-10 ENCOUNTER — Ambulatory Visit (HOSPITAL_COMMUNITY): Admitting: Licensed Clinical Social Worker

## 2023-10-10 ENCOUNTER — Encounter (HOSPITAL_COMMUNITY): Payer: Self-pay

## 2023-10-31 ENCOUNTER — Ambulatory Visit (INDEPENDENT_AMBULATORY_CARE_PROVIDER_SITE_OTHER): Admitting: Licensed Clinical Social Worker

## 2023-10-31 DIAGNOSIS — F1020 Alcohol dependence, uncomplicated: Secondary | ICD-10-CM | POA: Diagnosis not present

## 2023-10-31 DIAGNOSIS — F39 Unspecified mood [affective] disorder: Secondary | ICD-10-CM

## 2023-10-31 NOTE — Progress Notes (Signed)
 THERAPIST PROGRESS NOTE  Session Time: 2:08 p.m. to 3:01 p.m.   Virtual Visit via Video Note   I connected with Samuel Cunningham at 2:08  p.m.EST by a video enabled telemedicine application and verified that I am speaking with the correct person using two identifiers.   Location: Patient: [place], [place] Provider: GEANNIE Cher [facility] office   I discussed the limitations of evaluation and management by telemedicine and the availability of in person appointments. The patient expressed understanding and agreed to proceed.  Type of Therapy: Individual   Therapist Response/Interventions: Solution-Focused/The therapist explains that it is not surprising that Samuel Cunningham is having concentration problems given that he is undergoing a change in his psychotropic medications and has not drunk alcohol in two months after having drunk for over 20 years.  The therapist educates Samuel Cunningham about PAWS and the reasons that using marijuana is a barrier to recovery and will likely end up with his drinking again. The therapist discusses the need to avoid people, places, and things associated with using and discusses how to set limits with others who are active addiction while not telling them what to do.   Treatment Goals addressed:  Active     Substance Use     Samuel Cunningham will abstain from alcohol on a daily basis while beginning to develop sober supports via things like attending Alcoholics Anonymous per self-report. (Not Progressing)     Start:  01/19/23    Expected End:  07/20/23         Samuel Cunningham will experience a reduction in his depression and anxiety as evidenced by his PHQ-9 and GAD-7 both being a 4 or less. (Not Applicable)     Start:  01/19/23    Expected End:  07/20/23    Resolved:  09/12/23      The therapist will assist Samuel Cunningham in being able to identify and avoid triggers for drinking and assist him in overcoming any barriers or obstacles towards developing a sober support system.     Start:  01/19/23          The therapist will assist Samuel Cunningham in the identifying and changing thoughts and behaviors that contribute to his feelings of depression and anxiety.     Start:  01/19/23      Samuel Cunningham gives verbal permission for this therapist to electronically signed his care plan on his behalf.                     Summary: Samuel Cunningham presents saying that he talked to his doctor about trying Wellbutrin  saying that he is not sure if he likes it or not. He says that he has been in a funk and not feeling right. He says that he has been on Lexapro  for years. He says that he feels real forgetful like the Wellbutrin  is messing with is cognitive function. He says that he is still on the Lexapro  as well as the Wellbutrin . He is trying to taper off the Lexapro  reducing from 10 mg to 5 mg.   He says that he has not drunk alcohol but did a little weed two weeks. He last drank alcohol a couple of months ago. Has not been to any meetings since he last saw this therapist. Samuel Cunningham says that things were going good but they started arguing more the past week or so. His wife does not have a problem with the pot as she smokes pot two to three times per week.   The major focus of the session  involves why Samuel Cunningham feels the way he does currently and his statement concerning his not being sure he can give up using people questioning what to do in relation to his family who are all in active addiction.  At the conclusion of the session, he does recognize that the limit setting the therapist discusses with him is not telling other people what to do as he initially fears it would be. He agrees to start attending more meetings and listening to other people's stories of what worked and what did not work that led to their sobriety.    Progress Towards Goals: Not progressing  Suicidal/Homicidal: No SI or HI  Plan: Samuel Cunningham will return in 2 weeks.  Diagnosis: Alcohol Use Disorder, Severe and Unspecified Affective  Disorder  Collaboration of Care: wife to attend next appointment  Patient/Guardian was advised Release of Information must be obtained prior to any record release in order to collaborate their care with an outside provider. Patient/Guardian was advised if they have not already done so to contact the registration department to sign all necessary forms in order for us  to release information regarding their care.   Consent: Patient/Guardian gives verbal consent for treatment and assignment of benefits for services provided during this visit. Patient/Guardian expressed understanding and agreed to proceed.   Samuel Maier, MA, LCSW, [facility], LCAS 10/31/2023

## 2023-11-16 ENCOUNTER — Ambulatory Visit (HOSPITAL_COMMUNITY): Admitting: Licensed Clinical Social Worker

## 2023-11-16 DIAGNOSIS — F1020 Alcohol dependence, uncomplicated: Secondary | ICD-10-CM | POA: Diagnosis not present

## 2023-11-16 DIAGNOSIS — F39 Unspecified mood [affective] disorder: Secondary | ICD-10-CM

## 2023-11-16 NOTE — Progress Notes (Signed)
 THERAPIST PROGRESS NOTE  Session Time: 9 a.m. to 9:35 a.m.   Virtual Visit via Video Note   I connected with Mickeal at 9 a.m.SABRAEST by a video enabled telemedicine application and verified that I am speaking with the correct person using two identifiers.   Location: Patient: [place], [place] Provider: GEANNIE Cher [facility] office   I discussed the limitations of evaluation and management by telemedicine and the availability of in person appointments. The patient expressed understanding and agreed to proceed.  Type of Therapy: Individual   Therapist Response/Interventions: Solution-Focused/The therapist suggests Dinesh call his insurance company to find a couples therapist and set up the appointment. The therapist observes that given that his wife is under a lot of stress and they are in a tiny trailer that this is not conducive to wanting to have sex.   The therapist encourages Jullien to take a timeout when things get to a point in which he is so upset that he may say things he regrets. 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   The therapist informs Kevontay that his self-control should get better the longer he stays sober and discusses the need to keep stress at a minimum as stress can lead to snapping or lashing out.   The therapist encourages Julie to keep a log of any episodes of losing his temper and saying things he regrets between now and when he returns in a month.   Treatment Goals addressed:  Active     Substance Use     Wellington will abstain from alcohol on a daily basis while beginning to develop sober supports via things like attending Alcoholics Anonymous per self-report. (Progressing)     Start:  01/19/23    Expected End:  07/20/23         Jaamal will experience a reduction in his depression and anxiety as evidenced by his PHQ-9 and GAD-7 both being a 4 or less. (Not Applicable)      Start:  01/19/23    Expected End:  07/20/23    Resolved:  09/12/23      The therapist will assist Karlos in being able to identify and avoid triggers for drinking and assist him in overcoming any barriers or obstacles towards developing a sober support system.     Start:  01/19/23         The therapist will assist Ramirez in the identifying and changing thoughts and behaviors that contribute to his feelings of depression and anxiety.     Start:  01/19/23      [name] gives verbal permission for this therapist to electronically signed his care plan on his behalf.                        Summary: Octavian presents saying that he is feeling a little better and is still on 5 mg of Lexapro  along with the Wellbutrin  which he says may be actually working as he has more energy and can focus better.   He says that he last drank alcohol in July and cannot remember when he last smoked pot. He says that he has been so busy from work and a side business that he has not made a meeting. Chaston says that his wife is not in a good head space having to get on Celexa. He believes it is her job and she believes Aquil hates her  with [name] assuring he does not hate her. They are still in a small camper while they are building a house.   Their house should be ready in about three or four months. He says that he will still hang out with his family and drink non-alcoholic beers but will not invite them over to drink beers.   He says that he needs to work on not saying little hurtful things to his wife. He says that when they argue he will say they are doomed. His wife also gets upset when he makes sexual comments.   When the therapist asks about marital therapy that Lannie agrees that they need to do this. He says that he told her the other night that they need to talk with someone.   Progress Towards Goals: Progressing in not using but not in terms of sober support  Suicidal/Homicidal: No SI  or HI  Plan: Mikko will schedule with a couples therapist; he will keep a log regarding his temper; and he says that he will attend a couple of virtual AA meetings. He will see this therapist in one month or sooner as needed.   Diagnosis: Alcohol Use Disorder, Severe and Unspecified Affective Disorder  Collaboration of Care: N/A  Patient/Guardian was advised Release of Information must be obtained prior to any record release in order to collaborate their care with an outside provider. Patient/Guardian was advised if they have not already done so to contact the registration department to sign all necessary forms in order for us  to release information regarding their care.   Consent: Patient/Guardian gives verbal consent for treatment and assignment of benefits for services provided during this visit. Patient/Guardian expressed understanding and agreed to proceed.   [name], MA, LCSW, [facility], LCAS 11/16/2023

## 2023-12-14 ENCOUNTER — Ambulatory Visit (INDEPENDENT_AMBULATORY_CARE_PROVIDER_SITE_OTHER): Payer: Self-pay | Admitting: Licensed Clinical Social Worker

## 2023-12-14 ENCOUNTER — Telehealth (HOSPITAL_COMMUNITY): Payer: Self-pay | Admitting: Licensed Clinical Social Worker

## 2023-12-14 ENCOUNTER — Encounter (HOSPITAL_COMMUNITY): Payer: Self-pay | Admitting: Licensed Clinical Social Worker

## 2023-12-14 ENCOUNTER — Encounter (HOSPITAL_COMMUNITY): Payer: Self-pay

## 2023-12-14 DIAGNOSIS — Z5329 Procedure and treatment not carried out because of patient's decision for other reasons: Secondary | ICD-10-CM

## 2023-12-14 NOTE — Progress Notes (Signed)
 Samuel Cunningham does not show for his tele-video session with the therapist text messaging him the link to the meeting at 9 a.m. The therapist will send no show correspondence.  [name], MA, LCSW, [facility], [place], LCAS 12/14/2023

## 2023-12-14 NOTE — Progress Notes (Deleted)
 THERAPIST PROGRESS NOTE  Session Time: 9 a.m. to 9:35 a.m.   Virtual Visit via Video Note   I connected with Samuel Cunningham at 9 a.m.Samuel Cunningham by a video enabled telemedicine application and verified that I am speaking with the correct person using two identifiers.   Location: Patient: Samuel Cunningham, [place] Provider: GEANNIE Cher Cunningham office   I discussed the limitations of evaluation and management by telemedicine and the availability of in person appointments. The patient expressed understanding and agreed to proceed.  Type of Therapy: Individual   Therapist Response/Interventions: Solution-Focused/The therapist suggests Samuel Cunningham call his insurance company to find a couples therapist and set up the appointment. The therapist observes that given that his wife is under a lot of stress and they are in a tiny trailer that this is not conducive to wanting to have sex.   The therapist encourages Samuel Cunningham to take a timeout when things get to a point in which he is so upset that he may say things he regrets. 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   The therapist informs Samuel Cunningham that his self-control should get better the longer he stays sober and discusses the need to keep stress at a minimum as stress can lead to snapping or lashing out.   The therapist encourages Samuel Cunningham to keep a log of any episodes of losing his temper and saying things he regrets between now and when he returns in a month.   Treatment Goals addressed:                     Summary: Samuel Cunningham presents saying that he is feeling a little better and is still on 5 mg of Lexapro  along with the Wellbutrin  which he says may be actually working as he has more energy and can focus better.   He says that he last drank alcohol in July and cannot remember when he last smoked pot. He says that he has been so busy from work and a side business that he has not made a  meeting. Samuel Cunningham says that his wife is not in a good head space having to get on Celexa. He believes it is her job and she believes Samuel Cunningham hates her with Samuel Cunningham assuring he does not hate her. They are still in a small camper while they are building a house.   Their house should be ready in about three or four months. He says that he will still hang out with his family and drink non-alcoholic beers but will not invite them over to drink beers.   He says that he needs to work on not saying little hurtful things to his wife. He says that when they argue he will say they are doomed. His wife also gets upset when he makes sexual comments.   When the therapist asks about marital therapy that Samuel Cunningham agrees that they need to do this. He says that he told her the other night that they need to talk with someone.   Progress Towards Goals: Progressing in not using but not in terms of sober support  Suicidal/Homicidal: No SI or HI  Plan: Samuel Cunningham will schedule with a couples therapist; he will keep a log regarding his temper; and he says that he will attend a couple of virtual AA meetings. He will see this therapist in one month or sooner as needed.   Diagnosis: Alcohol Use Disorder, Severe and Unspecified Affective  Disorder  Collaboration of Care: N/A  Patient/Guardian was advised Release of Information must be obtained prior to any record release in order to collaborate their care with an outside provider. Patient/Guardian was advised if they have not already done so to contact the registration department to sign all necessary forms in order for us  to release information regarding their care.   Consent: Patient/Guardian gives verbal consent for treatment and assignment of benefits for services provided during this visit. Patient/Guardian expressed understanding and agreed to proceed.   Samuel Maier, MA, LCSW, [facility], LCAS 11/16/2023

## 2023-12-14 NOTE — Telephone Encounter (Signed)
 Note in error.

## 2024-01-17 ENCOUNTER — Ambulatory Visit: Payer: Self-pay

## 2024-01-17 NOTE — Telephone Encounter (Signed)
 FYI Only or Action Required?: FYI only for provider: appointment scheduled on 12/5.  Patient was last seen in primary care on 10/02/2023 by Duanne Butler DASEN, MD.  Called Nurse Triage reporting Depression.  Symptoms began several weeks ago.  Interventions attempted: Prescription medications: wellbutrin .  Symptoms are: gradually improving.  Triage Disposition: See Physician Within 24 Hours  Patient/caregiver understands and will follow disposition?: Yes- Friday AM  Copied from CRM #[id number]. Topic: Clinical - Red Word Triage >> Jan 17, 2024  9:11 AM Eva FALCON wrote: Red Word that prompted transfer to Nurse Triage: Anxiety has gotten worse, medication is not helping. DT denied me to schedule. Reason for Disposition  [1] Depression AND [2] getting worse (e.g., sleeping poorly, less able to do activities of daily living)  Answer Assessment - Initial Assessment Questions At LOV in August- Lexapro  tapered off while Wellbutrin  ramped up. Felt great while he was taking both meds but once Lexapro  was stopped, patient started feeling like Wellbutrin  increased his anxiety and depression. Had episodes of dizziness and brain fog. He has since stopped 11/23. Side effects have almost resolved but still with increased depression and anxiety. Denies SI/HI and declines support phone #s.   Appt with PCP 12/5 to discuss an alternative. Patient understands ED/UC precautions.   1. CONCERN: What happened that made you call today?     Increased anxiety depression  2. DEPRESSION SYMPTOM SCREENING: How are you feeling overall? (e.g., decreased energy, increased sleeping or difficulty sleeping, difficulty concentrating, feelings of sadness, guilt, hopelessness, or worthlessness)     Hard time completing day to day activities  3. RISK OF HARM - SUICIDAL IDEATION:  Do you ever have thoughts of hurting or killing yourself?  (e.g., yes, no, no but preoccupation with thoughts about death)     denies 4. RISK OF  HARM - HOMICIDAL IDEATION:  Do you ever have thoughts of hurting or killing someone else?  (e.g., yes, no, no but preoccupation with thoughts about death)     denies 5. FUNCTIONAL IMPAIRMENT: How have things been going for you overall? Have you had more difficulty than usual doing your normal daily activities?  (e.g., better, same, worse; self-care, school, work, interactions)     Hard time with day to day  6. SUPPORT: Who is with you now? Who do you live with? Do you have family or friends who you can talk to?      Wife and a couple buddies  7. THERAPIST: Do you have a counselor or therapist? If Yes, ask: What is their name?     Had a therapist- but wasn't right fit and needs a new one  8. STRESSORS: Has there been any new stress or recent changes in your life?     Everything with work and home stressing him out  9. ALCOHOL USE OR SUBSTANCE USE (DRUG USE): Do you drink alcohol or use any illegal drugs?     Denies  10. OTHER: Do you have any other physical symptoms right now? (e.g., fever)       Dizziness, brain fog  Answer Assessment - Initial Assessment Questions Dizziness while taking Wellbutrin - mostly resolved but still sometimes get it. Still swimmy headed   1. DESCRIPTION: Describe your dizziness.     Light headed  2. LIGHTHEADED: Do you feel lightheaded? (e.g., somewhat faint, woozy, weak upon standing)     Swimmy headed, out of it 3. VERTIGO: Do you feel like either you or the room is spinning or tilting? (i.e.,  vertigo)     denies 4. SEVERITY: How bad is it?  Do you feel like you are going to faint? Can you stand and walk?     Mild-moderate 5. ONSET:  When did the dizziness begin?     While taking Wellbutrin   6. AGGRAVATING FACTORS: Does anything make it worse? (e.g., standing, change in head position)     Wellbutrin , sitting to standing  7. HEART RATE: Can you tell me your heart rate? How many beats in 15 seconds?  (Note: Not all  patients can do this.)       Elevated 8. CAUSE: What do you think is causing the dizziness? (e.g., decreased fluids or food, diarrhea, emotional distress, heat exposure, new medicine, sudden standing, vomiting; unknown)     Medication  9. RECURRENT SYMPTOM: Have you had dizziness before? If Yes, ask: When was the last time? What happened that time?     With medication 10. OTHER SYMPTOMS: Do you have any other symptoms? (e.g., fever, chest pain, vomiting, diarrhea, bleeding)       Brain fog  Protocols used: Depression-A-AH, Dizziness - Lightheadedness-A-AH

## 2024-01-19 ENCOUNTER — Telehealth: Payer: Self-pay | Admitting: Family Medicine

## 2024-01-19 DIAGNOSIS — F411 Generalized anxiety disorder: Secondary | ICD-10-CM

## 2024-01-19 MED ORDER — ESCITALOPRAM OXALATE 10 MG PO TABS: 10.0000 mg | ORAL_TABLET | Freq: Every day | ORAL | 3 refills | Status: AC

## 2024-01-19 NOTE — Progress Notes (Signed)
 Subjective:    Patient ID: Samuel Cunningham, male    DOB: [birth year], 33 y.o.   MRN: [id number]  HPI Patient is being seen today as a video visit.  Video call began at 9:00.  Video call concluded at 912.  Patient is currently at home.  I am currently in my office.  He consents to be seen via video.  He has been on Lexapro  10 mg a day for many years for generalized anxiety disorder.  In the last year, he wanted to try to stop the medication.  We discussed this in August.  He states that he had weight gain on the medication so my plan was for the patient to switch to Wellbutrin  in place of the Lexapro .  Instead the patient was taking Lexapro  and Wellbutrin  together.  He states that the Wellbutrin  seem to help with his concentration.  He felt very good on the combination.  However he then weaned himself off the medication altogether.  He has not been taking any medication for more than a month.  Over the last couple of weeks, he reports worsening anxiety.  He states the last week has been extremely bad for him.  He would like to resume the medication.  He does not feel that he wants to go back on the Wellbutrin .  He wants to simply start the Lexapro . Past Medical History:  Diagnosis Date   Allergy     Anxiety    Past Surgical History:  Procedure Laterality Date   REPAIR ANKLE LIGAMENT     WISDOM TOOTH EXTRACTION     Current Outpatient Medications on File Prior to Visit  Medication Sig Dispense Refill   buPROPion  (WELLBUTRIN  XL) 150 MG 24 hr tablet Take 1 tablet (150 mg total) by mouth daily. 30 tablet 5   escitalopram  (LEXAPRO ) 5 MG tablet Take 1 tablet (5 mg total) by mouth daily. 90 tablet 3   No current facility-administered medications on file prior to visit.   Allergies  Allergen Reactions   Codeine Itching and Nausea Only   Social History   Socioeconomic History   Marital status: Single    Spouse name: Not on file   Number of children: Not on file   Years of education: Not on  file   Highest education level: Not on file  Occupational History   Not on file  Tobacco Use   Smoking status: Former    Current packs/day: 0.00    Average packs/day: 0.5 packs/day for 10.0 years (5.0 ttl pk-yrs)    Types: Cigarettes    Start date: 02/14/2006    Quit date: 02/15/2016    Years since quitting: 7.9   Smokeless tobacco: Current    Types: Chew  Vaping Use   Vaping status: Never Used  Substance and Sexual Activity   Alcohol use: Yes   Drug use: No   Sexual activity: Not on file  Other Topics Concern   Not on file  Social History Narrative   Not on file   Social Drivers of Health   Financial Resource Strain: Low Risk  (07/11/2023)   Received from [facility]   Overall Financial Resource Strain (CARDIA)    Difficulty of Paying Living Expenses: Not hard at all  Food Insecurity: No Food Insecurity (07/11/2023)   Received from [facility]   Hunger Vital Sign    Within the past 12 months, you worried that your food would run out before you got the money to buy more.: Never true  Within the past 12 months, the food you bought just didn't last and you didn't have money to get more.: Never true  Transportation Needs: No Transportation Needs (07/11/2023)   Received from [facility]   PRAPARE - Transportation    Lack of Transportation (Medical): No    Lack of Transportation (Non-Medical): No  Physical Activity: Sufficiently Active (07/11/2023)   Received from [facility]   Exercise Vital Sign    On average, how many days per week do you engage in moderate to strenuous exercise (like a brisk walk)?: 3 days    On average, how many minutes do you engage in exercise at this level?: 60 min  Stress: No Stress Concern Present (07/11/2023)   Received from [facility] - Occupational Stress Questionnaire    Feeling of Stress : Only a little  Social Connections: Socially Integrated (07/11/2023)   Received from [facility]    Social Network    How would you rate your social network (family, work, friends)?: Good participation with social networks  Intimate Partner Violence: Not At Risk (07/11/2023)   Received from [facility]   HITS    Over the last 12 months how often did your partner physically hurt you?: Never    Over the last 12 months how often did your partner insult you or talk down to you?: Never    Over the last 12 months how often did your partner threaten you with physical harm?: Never    Over the last 12 months how often did your partner scream or curse at you?: Never         Review of Systems  All other systems reviewed and are negative.      Objective:          GAD (generalized anxiety disorder) To reiterate, the patient discontinued Lexapro  (1) due to weight gain and (2) due to research he had done on the Internet about possible side effects of the medication.  Over the last month, he has been on no medication however his anxiety is starting to return.  He would like to resume the medication.  I will send in Lexapro  10 mg a day 90 tablets with a years worth of refills.  He will let me know in about a month if the symptoms are not improving.
